# Patient Record
Sex: Female | Born: 1985 | Race: White | Hispanic: No | Marital: Married | Smoking: Former smoker
Health system: Southern US, Community
[De-identification: ages and names within clinical notes are randomized; demographics above are authoritative.]

## PROBLEM LIST (undated history)

## (undated) DIAGNOSIS — Z8619 Personal history of other infectious and parasitic diseases: Secondary | ICD-10-CM

## (undated) DIAGNOSIS — Z720 Tobacco use: Secondary | ICD-10-CM

## (undated) DIAGNOSIS — L509 Urticaria, unspecified: Secondary | ICD-10-CM

## (undated) HISTORY — DX: Urticaria, unspecified: L50.9

## (undated) HISTORY — PX: HERNIA REPAIR: SHX51

## (undated) HISTORY — DX: Personal history of other infectious and parasitic diseases: Z86.19

## (undated) HISTORY — DX: Tobacco use: Z72.0

## (undated) HISTORY — PX: CHOLECYSTECTOMY: SHX55

---

## 2011-04-04 LAB — LIPID PANEL
LDL Cholesterol: 164 mg/dL
Triglycerides: 220 mg/dL — AB (ref 40–160)

## 2011-04-04 LAB — CBC AND DIFFERENTIAL
HCT: 39 % (ref 36–46)
Platelets: 216 10*3/uL (ref 150–399)

## 2011-04-04 LAB — BASIC METABOLIC PANEL
Creatinine: 0.6 mg/dL (ref 0.5–1.1)
Glucose: 78 mg/dL

## 2011-08-10 ENCOUNTER — Encounter (HOSPITAL_BASED_OUTPATIENT_CLINIC_OR_DEPARTMENT_OTHER): Payer: Self-pay

## 2011-08-10 ENCOUNTER — Emergency Department (HOSPITAL_BASED_OUTPATIENT_CLINIC_OR_DEPARTMENT_OTHER)
Admission: EM | Admit: 2011-08-10 | Discharge: 2011-08-10 | Disposition: A | Discharge status: Home / Self Care | Payer: BC Managed Care – PPO | Attending: Emergency Medicine | Admitting: Emergency Medicine

## 2011-08-10 DIAGNOSIS — IMO0002 Reserved for concepts with insufficient information to code with codable children: Secondary | ICD-10-CM | POA: Insufficient documentation

## 2011-08-10 DIAGNOSIS — F17200 Nicotine dependence, unspecified, uncomplicated: Secondary | ICD-10-CM | POA: Insufficient documentation

## 2011-08-10 DIAGNOSIS — S7010XA Contusion of unspecified thigh, initial encounter: Secondary | ICD-10-CM | POA: Insufficient documentation

## 2011-08-10 DIAGNOSIS — W540XXA Bitten by dog, initial encounter: Secondary | ICD-10-CM | POA: Insufficient documentation

## 2011-08-10 MED ORDER — TETANUS-DIPHTH-ACELL PERTUSSIS 5-2.5-18.5 LF-MCG/0.5 IM SUSP
0.5000 mL | Freq: Once | INTRAMUSCULAR | Status: AC
  Administered 2011-08-10: 0.5 mL via INTRAMUSCULAR
  Filled 2011-08-10: qty 0.5

## 2011-08-10 MED ORDER — RABIES IMMUNE GLOBULIN 150 UNIT/ML IM INJ
20.0000 [IU]/kg | INJECTION | Freq: Once | INTRAMUSCULAR | Status: AC
  Administered 2011-08-10: 1425 [IU] via INTRAMUSCULAR
  Filled 2011-08-10: qty 9.5

## 2011-08-10 MED ORDER — RABIES VACCINE, PCEC IM SUSR
1.0000 mL | Freq: Once | INTRAMUSCULAR | Status: AC
  Administered 2011-08-10: 1 mL via INTRAMUSCULAR
  Filled 2011-08-10: qty 1

## 2011-08-10 NOTE — ED Notes (Signed)
Bit by unknown dog yesterday-bite to left thigh and left wrist

## 2011-08-10 NOTE — Discharge Instructions (Signed)
Animal Bite An animal bite can result in a scratch on the skin, deep open cut, puncture of the skin, crush injury, or tearing away of the skin or a body part. Dogs are responsible for most animal bites. Children are bitten more often than adults. An animal bite can range from very mild to more serious. A small bite from your house pet is no cause for alarm. However, some animal bites can become infected or injure a bone or other tissue.  SEEK MEDICAL CARE IF:  You notice warmth, redness, soreness, swelling, pus discharge, or a bad smell coming from the wound.   You have a red line on the skin coming from the wound.   You have a fever, chills, or a general ill feeling.   You have nausea or vomiting.   You have continued or worsening pain.   You have trouble moving the injured part.   You have other questions or concerns.  MAKE SURE YOU:  Understand these instructions.   Will watch your condition.   Will get help right away if you are not doing well or get worse.  Document Released: 09/29/2010 Document Revised: 12/31/2010 Document Reviewed: 09/29/2010 [facility] Patient Information 2012 [place], [place].

## 2011-08-10 NOTE — ED Provider Notes (Signed)
History     CSN: [id number]  Arrival date & time 08/10/11  1546   First MD Initiated Contact with Patient 08/10/11 1556      Chief Complaint  Patient presents with  . Animal Bite    (Consider location/radiation/quality/duration/timing/severity/associated sxs/prior treatment) HPI Pt sustained dog bite to Left thigh yesterday. Unknown dog or owner. Unknown rabies status of dog. Pt had some minor scratches to L wrist. She presents for rabies prophylaxis and Td update. Pt states she cleaned the wound thoroughly after bite yesterday and applied antibiotic cream History reviewed. No pertinent past medical history.  History reviewed. No pertinent past surgical history.  No family history on file.  History  Substance Use Topics  . Smoking status: Current Everyday Smoker  . Smokeless tobacco: Not on file  . Alcohol Use: Yes    OB History    Grav Para Term Preterm Abortions TAB SAB Ect Mult Living                  Review of Systems  Gastrointestinal: Negative for vomiting and diarrhea.  Skin: Positive for wound. Negative for rash.    Allergies  Review of patient's allergies indicates no known allergies.  Home Medications   Current Outpatient Rx  Name Route Sig Dispense Refill  . IBUPROFEN 200 MG PO TABS Oral Take 400 mg by mouth every 6 (six) hours as needed. Patient used this medication for menstrual cramps.      BP 127/81  Pulse 80  Temp 98 F (36.7 C) (Oral)  Resp 16  Ht 5\' 2"  (1.575 m)  Wt 159 lb (72.122 kg)  BMI 29.08 kg/m2  SpO2 100%  LMP 07/30/2011  Physical Exam  Nursing note and vitals reviewed. Constitutional: She is oriented to person, place, and time. She appears well-developed and well-nourished. No distress.  HENT:  Head: Normocephalic and atraumatic.  Mouth/Throat: Oropharynx is clear and moist.  Eyes: EOM are normal.  Neck: Neck supple.  Pulmonary/Chest: Effort normal.  Musculoskeletal: Normal range of motion.  Neurological: She is alert  and oriented to person, place, and time.  Skin: Skin is warm and dry.       Bite mark to left thigh with signs of healing. Mild redness but not evidence of cellulitis. No FB palpated. Surrounding ecchymosis. L wrist with mild abrasions. Normal ROM.   Psychiatric: She has a normal mood and affect. Her behavior is normal.    ED Course  Procedures (including critical care time)  Labs Reviewed - No data to display No results found.   1. Dog bite       MDM  Will give Td, rabies IG and 1 dose of vaccine. Nursing staff will arrange further dosing. Do not believe antibiotics needed given minor wound and no evidence of infection at this point. Pt should return for worsening reddening or any concerns        Loren Racer, MD 08/10/11 1645

## 2011-08-13 ENCOUNTER — Emergency Department
Admission: EM | Admit: 2011-08-13 | Discharge: 2011-08-13 | Disposition: A | Discharge status: Home / Self Care | Payer: BC Managed Care – PPO | Source: Home / Self Care

## 2011-08-13 DIAGNOSIS — Z203 Contact with and (suspected) exposure to rabies: Secondary | ICD-10-CM

## 2011-08-13 DIAGNOSIS — Z23 Encounter for immunization: Secondary | ICD-10-CM

## 2011-08-13 MED ORDER — RABIES VACCINE, PCEC IM SUSR
1.0000 mL | Freq: Once | INTRAMUSCULAR | Status: AC
  Administered 2011-08-13: 1 mL via INTRAMUSCULAR

## 2011-08-13 NOTE — ED Notes (Signed)
Patient observed for 15 minutes and no visible reaction.

## 2011-08-13 NOTE — ED Notes (Signed)
Patient is here for her 2nd rabies vaccine

## 2011-08-17 ENCOUNTER — Encounter: Payer: Self-pay | Admitting: *Deleted

## 2011-08-17 ENCOUNTER — Emergency Department (INDEPENDENT_AMBULATORY_CARE_PROVIDER_SITE_OTHER)
Admission: EM | Admit: 2011-08-17 | Discharge: 2011-08-17 | Disposition: A | Discharge status: Home / Self Care | Payer: BC Managed Care – PPO | Source: Home / Self Care

## 2011-08-17 DIAGNOSIS — Z23 Encounter for immunization: Secondary | ICD-10-CM

## 2011-08-17 MED ORDER — RABIES VACCINE, PCEC IM SUSR
1.0000 mL | Freq: Once | INTRAMUSCULAR | Status: AC
  Administered 2011-08-17: 1 mL via INTRAMUSCULAR

## 2011-08-17 NOTE — ED Notes (Signed)
The pt is here today for her third rabies vaccine.

## 2011-08-24 ENCOUNTER — Encounter: Payer: Self-pay | Admitting: *Deleted

## 2011-08-24 ENCOUNTER — Emergency Department
Admission: EM | Admit: 2011-08-24 | Discharge: 2011-08-24 | Disposition: A | Discharge status: Home / Self Care | Payer: BC Managed Care – PPO | Source: Home / Self Care

## 2011-08-24 DIAGNOSIS — Z23 Encounter for immunization: Secondary | ICD-10-CM

## 2011-08-24 MED ORDER — RABIES VACCINE, PCEC IM SUSR
1.0000 mL | Freq: Once | INTRAMUSCULAR | Status: AC
  Administered 2011-08-24: 1 mL via INTRAMUSCULAR

## 2011-08-24 NOTE — ED Notes (Signed)
The pt is here today for her 4th rabies vaccine.

## 2012-05-03 ENCOUNTER — Encounter: Payer: Self-pay | Admitting: Family Medicine

## 2012-05-03 ENCOUNTER — Ambulatory Visit (INDEPENDENT_AMBULATORY_CARE_PROVIDER_SITE_OTHER): Payer: BC Managed Care – PPO | Admitting: Family Medicine

## 2012-05-03 VITALS — BP 113/71 | HR 86 | Temp 97.4°F | Ht 60.75 in | Wt 171.0 lb

## 2012-05-03 DIAGNOSIS — A499 Bacterial infection, unspecified: Secondary | ICD-10-CM

## 2012-05-03 DIAGNOSIS — J208 Acute bronchitis due to other specified organisms: Secondary | ICD-10-CM

## 2012-05-03 DIAGNOSIS — B372 Candidiasis of skin and nail: Secondary | ICD-10-CM

## 2012-05-03 DIAGNOSIS — J209 Acute bronchitis, unspecified: Secondary | ICD-10-CM

## 2012-05-03 DIAGNOSIS — Z720 Tobacco use: Secondary | ICD-10-CM | POA: Insufficient documentation

## 2012-05-03 DIAGNOSIS — F17200 Nicotine dependence, unspecified, uncomplicated: Secondary | ICD-10-CM

## 2012-05-03 DIAGNOSIS — B9689 Other specified bacterial agents as the cause of diseases classified elsewhere: Secondary | ICD-10-CM

## 2012-05-03 DIAGNOSIS — B360 Pityriasis versicolor: Secondary | ICD-10-CM

## 2012-05-03 HISTORY — DX: Tobacco use: Z72.0

## 2012-05-03 MED ORDER — CLOTRIMAZOLE 1 % EX CREA: TOPICAL_CREAM | CUTANEOUS | Status: AC

## 2012-05-03 MED ORDER — DOXYCYCLINE HYCLATE 100 MG PO TABS: ORAL_TABLET | ORAL | Status: AC

## 2012-05-03 MED ORDER — KETOCONAZOLE 2 % EX SHAM: MEDICATED_SHAMPOO | CUTANEOUS | Status: DC

## 2012-05-03 NOTE — Patient Instructions (Addendum)
330 or greater is your peak flow goal.

## 2012-05-03 NOTE — Progress Notes (Signed)
CC: Cynthia Christensen is a 27 y.o. female is here for Establish Care, chest congestion and yeast on skin   Subjective: HPI:  Pleasant 27 year old here to establish care  Patient complains of a itchy rash underneath her right breast. It has been present for one week. Seems to be getting worse on a daily basis. Worse in moist environments. No interventions as of yet. Described only as red and itchy. She has had this multiple times years ago.  Patient complains of white spots on her upper trunk extending towards the biceps. Has been present for 2 weeks. Has had in the past, seems to get worse when exposed to the sun. Improves with Selsun Blue. She's been using this for the past week daily basis and symptoms are no better nor worse since starting. Discoloration is not itchy nor painful. She is self-conscious about it.  Patient complains of a sore throat and cough. Cough is productive of white sputum. Has been present for 5 days and was preceded by facial pressure and runny nose that is now gone since starting Claritin. Describes wheezing and chest tightness especially with exertion. Denies orthopnea, peripheral edema, blood and sputum. Smokes half a pack of cigarettes a day.  Review of Systems - General ROS: negative for - chills, fever, night sweats, weight gain or weight loss Ophthalmic ROS: negative for - decreased vision Psychological ROS: negative for - anxiety or depression ENT ROS: negative for - hearing change, tinnitus Hematological and Lymphatic ROS: negative for - bleeding problems, bruising or swollen lymph nodes Breast ROS: negative Cardiovascular ROS: no chest pain on exertion Gastrointestinal ROS: no abdominal pain, change in bowel habits, or black or bloody stools Genito-Urinary ROS: negative for - genital discharge, genital ulcers, incontinence or abnormal bleeding from genitals Musculoskeletal ROS: negative for - joint pain or muscle pain Neurological ROS: negative for - headaches or  memory loss Dermatological ROS: negative for lumps, mole changes, rash and skin lesion changes other than above  Past Medical History  Diagnosis Date  . Tobacco use 05/03/2012     Family History  Problem Relation Age of Onset  . Lung cancer      grandmother  . Heart attack      grandfather     History  Substance Use Topics  . Smoking status: Current Every Day Smoker -- 0.50 packs/day for 5 years    Types: Cigarettes  . Smokeless tobacco: Not on file  . Alcohol Use: Yes     Objective: Filed Vitals:   05/03/12 1506  BP: 113/71  Pulse: 86  Temp: 97.4 F (36.3 C)    General: Alert and Oriented, No Acute Distress HEENT: Pupils equal, round, reactive to light. Conjunctivae clear.  External ears unremarkable, canals clear with intact TMs with appropriate landmarks.  Middle ear appears open without effusion. Pink inferior turbinates.  Moist mucous membranes, pharynx without inflammation nor lesions.  Neck supple without palpable lymphadenopathy nor abnormal masses. Lungs: Comfortable work of breathing, no accessory muscle use, mild end expiratory wheezing without rhonchi nor rales or signs of consolidation Cardiac: Regular rate and rhythm. Normal S1/S2.  No murmurs, rubs, nor gallops.   Extremities: No peripheral edema.  Strong peripheral pulses.  Mental Status: No depression, anxiety, nor agitation. Skin: Warm and dry. Mild redness under the right breast with satellite lesions, scattered hypopigmented lesions in a mild sunburn background on the shoulders and biceps  Assessment & Plan: Cynthia Christensen was seen today for establish care, chest congestion and yeast on skin.  Diagnoses and associated orders for this visit:  Tinea versicolor - ketoconazole (NIZORAL) 2 % shampoo; Apply to splotchy/red spots three consecutive days for three weeks.  Candidal skin infection - clotrimazole (LOTRIMIN) 1 % cream; Apply to affected areas twice a day for up to four weeks, applying up to two weeks  after resolution of symptoms.  Acute bacterial bronchitis - doxycycline (VIBRA-TABS) 100 MG tablet; One by mouth twice a day for ten days.  Tobacco use  Other Orders - loratadine (CLARITIN) 10 MG tablet; Take 10 mg by mouth daily.    Tinea versicolor: Encouraged patient to start ketoconazole shampoo use as a body wash to the torso. Skin candidiasis: Encouraged patient to apply clotrimazole as directed in the skin folds with redness. Acute bacterial bronchitis: Discussed benefits of smoking by using nicotine replacement therapy, start doxycycline, continue Claritin and consider starting twice a day Mucinex.  Return if symptoms worsen or fail to improve.

## 2012-05-22 ENCOUNTER — Encounter: Payer: Self-pay | Admitting: Family Medicine

## 2012-05-22 ENCOUNTER — Encounter: Payer: Self-pay | Admitting: *Deleted

## 2012-05-22 DIAGNOSIS — E785 Hyperlipidemia, unspecified: Secondary | ICD-10-CM

## 2012-05-22 DIAGNOSIS — L680 Hirsutism: Secondary | ICD-10-CM | POA: Insufficient documentation

## 2012-06-27 ENCOUNTER — Encounter: Payer: Self-pay | Admitting: Family Medicine

## 2012-06-27 ENCOUNTER — Ambulatory Visit (INDEPENDENT_AMBULATORY_CARE_PROVIDER_SITE_OTHER): Payer: BC Managed Care – PPO | Admitting: Family Medicine

## 2012-06-27 VITALS — BP 113/77 | HR 63 | Temp 97.7°F | Wt 176.0 lb

## 2012-06-27 DIAGNOSIS — J209 Acute bronchitis, unspecified: Secondary | ICD-10-CM

## 2012-06-27 MED ORDER — AZITHROMYCIN 250 MG PO TABS: ORAL_TABLET | ORAL | Status: AC

## 2012-06-27 MED ORDER — HYDROCODONE-HOMATROPINE 5-1.5 MG/5ML PO SYRP: 5.0000 mL | ORAL_SOLUTION | Freq: Every evening | ORAL | Status: DC | PRN

## 2012-06-27 MED ORDER — PREDNISONE 20 MG PO TABS: ORAL_TABLET | ORAL | Status: AC

## 2012-06-27 NOTE — Progress Notes (Signed)
CC: Cynthia Christensen is a 27 y.o. female is here for chest congestion   Subjective: HPI:  Complains of one week of worsening productive cough with coughing spells to the point where she almost throws up. Symptoms are moderate in severity worsening on a daily basis.  symptoms started while in [place]. There is been wheezing and shortness of breath at night is slightly improved with albuterol requiring 2 puffs twice a day since onset of above symptoms. Admits chest congestion but no chest pain. Denies back pain continues to smoke. She denies fevers, chills, and vomiting, abdominal pain, headache, motor sensory disturbances.  She has tried guaifenesin up to 4 times a day without much improvement   Review Of Systems Outlined In HPI  Past Medical History  Diagnosis Date  . Tobacco use 05/03/2012     Family History  Problem Relation Age of Onset  . Lung cancer      grandmother  . Heart attack      grandfather  . Thyroid disease Mother      History  Substance Use Topics  . Smoking status: Current Every Day Smoker -- 0.50 packs/day for 5 years    Types: Cigarettes  . Smokeless tobacco: Not on file  . Alcohol Use: Yes     Objective: Filed Vitals:   06/27/12 0922  BP: 113/77  Pulse: 63  Temp: 97.7 F (36.5 C)    General: Alert and Oriented, No Acute Distress HEENT: Pupils equal, round, reactive to light. Conjunctivae clear.  External ears unremarkable, canals clear with intact TMs with appropriate landmarks.  Middle ear appears open without effusion. Pink inferior turbinates.  Moist mucous membranes, pharynx without inflammation nor lesions.  Neck supple without palpable lymphadenopathy nor abnormal masses. Lungs:  comfortable work of breathing, good air movement, trace end expiratory wheezing with mild central rhonchi without rales.  Cardiac: Regular rate and rhythm. Normal S1/S2.  No murmurs, rubs, nor gallops.   Mental Status: No depression, anxiety, nor agitation.   Assessment &  Plan: Cynthia Christensen was seen today for chest congestion.  Diagnoses and associated orders for this visit:  Acute bronchitis - predniSONE (DELTASONE) 20 MG tablet; Two tabs at once daily for five days. - azithromycin (ZITHROMAX) 250 MG tablet; Take two tabs at once on day 1, then one tab daily on days 2-5. - HYDROcodone-homatropine (HYCODAN) 5-1.5 MG/5ML syrup; Take 5 mLs by mouth at bedtime as needed for cough.    Acute bronchitis in a smoker: Start moderate dose prednisone with azithromycin may use Hycodan to help with sleep, encouraged to use Alka-Seltzer cold and sinus during the day for symptom control.  Return if symptoms worsen or fail to improve.

## 2012-08-07 DIAGNOSIS — Z87898 Personal history of other specified conditions: Secondary | ICD-10-CM | POA: Insufficient documentation

## 2012-08-07 DIAGNOSIS — L732 Hidradenitis suppurativa: Secondary | ICD-10-CM | POA: Insufficient documentation

## 2012-11-24 ENCOUNTER — Ambulatory Visit (INDEPENDENT_AMBULATORY_CARE_PROVIDER_SITE_OTHER): Payer: BC Managed Care – PPO | Admitting: Family Medicine

## 2012-11-24 ENCOUNTER — Encounter: Payer: Self-pay | Admitting: Family Medicine

## 2012-11-24 VITALS — BP 118/74 | HR 76 | Wt 184.0 lb

## 2012-11-24 DIAGNOSIS — K648 Other hemorrhoids: Secondary | ICD-10-CM

## 2012-11-24 MED ORDER — HYDROCORTISONE ACE-PRAMOXINE 1-1 % RE CREA: TOPICAL_CREAM | Freq: Two times a day (BID) | RECTAL | Status: DC

## 2012-11-24 NOTE — Progress Notes (Signed)
CC: Cynthia Christensen is a 27 y.o. female is here for Hemorrhoids   Subjective: HPI:  Complains of rectal swelling and mild discomfort that has been present for one week has not been getting better or worse since onset. Has been using Preparation H without much help. Scant bleeding yesterda with bowel movement. She denies constipation or straining to defecate nor pregnancy she's never had this before. Symptoms are worse with sitting for long periods of time she is felt a mass that enlarges throughout the day shrink overnight. Denies fevers, chills, abdominal pain, nor unintentional weight loss no family history of colon cancer   Review Of Systems Outlined In HPI  Past Medical History  Diagnosis Date  . Tobacco use 05/03/2012     Family History  Problem Relation Age of Onset  . Lung cancer      grandmother  . Heart attack      grandfather  . Thyroid disease Mother      History  Substance Use Topics  . Smoking status: Current Every Day Smoker -- 0.50 packs/day for 5 years    Types: Cigarettes  . Smokeless tobacco: Not on file  . Alcohol Use: Yes     Objective: Filed Vitals:   11/24/12 0827  BP: 118/74  Pulse: 76    General: Alert and Oriented, No Acute Distress HEENT: Pupils equal, round, reactive to light. Conjunctivae clear.  Moist mucous membranes Lungs:  are clear and comfortable work of breathing  Cardiac: Regular rate and rhythm. Normal S1/S2.  No murmurs, rubs, nor gallops.   Rectal: Politely declines rectal exam Extremities: No peripheral edema.  Strong peripheral pulses.  Mental Status: No depression, anxiety, nor agitation. Skin: Warm and dry.  Assessment & Plan: Cynthia Christensen was seen today for hemorrhoids.  Diagnoses and associated orders for this visit:  Prolapsed internal hemorrhoids - pramoxine-hydrocortisone (PROCTOCREAM-HC) 1-1 % rectal cream; Place rectally 2 (two) times daily.    Start psyllium powder 3 heaping teaspoons daily until one week after symptoms  subside, proctocream hc starting today.  Return to rule out thrombosis if pain worsens.  Return if symptoms worsen or fail to improve.

## 2013-01-03 ENCOUNTER — Encounter: Payer: Self-pay | Admitting: Family Medicine

## 2013-01-03 ENCOUNTER — Ambulatory Visit (INDEPENDENT_AMBULATORY_CARE_PROVIDER_SITE_OTHER): Payer: BC Managed Care – PPO | Admitting: Family Medicine

## 2013-01-03 VITALS — BP 108/67 | HR 81 | Resp 16 | Wt 185.0 lb

## 2013-01-03 DIAGNOSIS — J209 Acute bronchitis, unspecified: Secondary | ICD-10-CM

## 2013-01-03 DIAGNOSIS — J019 Acute sinusitis, unspecified: Secondary | ICD-10-CM

## 2013-01-03 MED ORDER — HYDROCODONE-HOMATROPINE 5-1.5 MG/5ML PO SYRP: 5.0000 mL | ORAL_SOLUTION | Freq: Every evening | ORAL | Status: DC | PRN

## 2013-01-03 MED ORDER — SULFAMETHOXAZOLE-TMP DS 800-160 MG PO TABS: 1.0000 | ORAL_TABLET | Freq: Two times a day (BID) | ORAL | Status: DC

## 2013-01-03 NOTE — Progress Notes (Signed)
   Subjective:    Patient ID: Cynthia Christensen, female    DOB: 27-Dec-1985, 27 y.o.   MRN: [id number]  HPI  7 days of cough and congestion.  Says usually gets bronchitis twice a year. Has been on Amox x 4 days BID. + nasal congestion.  + HA. Pressure under both eyes.  No ST or ear pain. No fever, chills. + sweats.  + fatigue.  Sputum is yellow/green. No hx of asthma. + smoker.   Review of Systems     Objective:   Physical Exam  Constitutional: She is oriented to person, place, and time. She appears well-developed and well-nourished.  HENT:  Head: Normocephalic and atraumatic.  Right Ear: External ear normal.  Left Ear: External ear normal.  Nose: Nose normal.  Mouth/Throat: Oropharynx is clear and moist.  TMs and canals are clear. Conjunctiva are injected.  Eyes: Conjunctivae and EOM are normal. Pupils are equal, round, and reactive to light.  Neck: Neck supple. No thyromegaly present.  Cardiovascular: Normal rate, regular rhythm and normal heart sounds.   Pulmonary/Chest: Effort normal. She has wheezes.  Slight expiratory wheeze at the bases bilaterally.  Lymphadenopathy:    She has no cervical adenopathy.  Neurological: She is alert and oriented to person, place, and time.  Skin: Skin is warm and dry.  Psychiatric: She has a normal mood and affect. Her behavior is normal.          Assessment & Plan:  Acute sinusitis/Bronchitis - will treat with Bactrim DS. Call if not better in one weell . Continue symptomatically her. Encourage smoking cessation. Refill her cough syrup. Last prescription was in June.

## 2013-01-03 NOTE — Patient Instructions (Signed)
Acute Bronchitis Bronchitis is inflammation of the airways that extend from the windpipe into the lungs (bronchi). The inflammation often causes mucus to develop. This leads to a cough, which is the most common symptom of bronchitis.  In acute bronchitis, the condition usually develops suddenly and goes away over time, usually in a couple weeks. Smoking, allergies, and asthma can make bronchitis worse. Repeated episodes of bronchitis may cause further lung problems.  CAUSES Acute bronchitis is most often caused by the same virus that causes a cold. The virus can spread from person to person (contagious).  SIGNS AND SYMPTOMS   Cough.   Fever.   Coughing up mucus.   Body aches.   Chest congestion.   Chills.   Shortness of breath.   Sore throat.  DIAGNOSIS  Acute bronchitis is usually diagnosed through a physical exam. Tests, such as chest X-rays, are sometimes done to rule out other conditions.  TREATMENT  Acute bronchitis usually goes away in a couple weeks. Often times, no medical treatment is necessary. Medicines are sometimes given for relief of fever or cough. Antibiotics are usually not needed but may be prescribed in certain situations. In some cases, an inhaler may be recommended to help reduce shortness of breath and control the cough. A cool mist vaporizer may also be used to help thin bronchial secretions and make it easier to clear the chest.  HOME CARE INSTRUCTIONS  Get plenty of rest.   Drink enough fluids to keep your urine clear or pale yellow (unless you have a medical condition that requires fluid restriction). Increasing fluids may help thin your secretions and will prevent dehydration.   Only take over-the-counter or prescription medicines as directed by your health care provider.   Avoid smoking and secondhand smoke. Exposure to cigarette smoke or irritating chemicals will make bronchitis worse. If you are a smoker, consider using nicotine gum or skin  patches to help control withdrawal symptoms. Quitting smoking will help your lungs heal faster.   Reduce the chances of another bout of acute bronchitis by washing your hands frequently, avoiding people with cold symptoms, and trying not to touch your hands to your mouth, nose, or eyes.   Follow up with your health care provider as directed.  SEEK MEDICAL CARE IF: Your symptoms do not improve after 1 week of treatment.  SEEK IMMEDIATE MEDICAL CARE IF:  You develop an increased fever or chills.   You have chest pain.   You have severe shortness of breath.  You have bloody sputum.   You develop dehydration.  You develop fainting.  You develop repeated vomiting.  You develop a severe headache. MAKE SURE YOU:   Understand these instructions.  Will watch your condition.  Will get help right away if you are not doing well or get worse. Document Released: 02/19/2004 Document Revised: 09/13/2012 Document Reviewed: 07/04/2012 [employer] Patient Information 2014 [employer].  Sinusitis Sinusitis is redness, soreness, and swelling (inflammation) of the paranasal sinuses. Paranasal sinuses are air pockets within the bones of your face (beneath the eyes, the middle of the forehead, or above the eyes). In healthy paranasal sinuses, mucus is able to drain out, and air is able to circulate through them by way of your nose. However, when your paranasal sinuses are inflamed, mucus and air can become trapped. This can allow bacteria and other germs to grow and cause infection. Sinusitis can develop quickly and last only a short time (acute) or continue over a long period (chronic). Sinusitis that   lasts for more than 12 weeks is considered chronic.  CAUSES  Causes of sinusitis include:  Allergies.  Structural abnormalities, such as displacement of the cartilage that separates your nostrils (deviated septum), which can decrease the air flow through your nose and sinuses and affect sinus  drainage.  Functional abnormalities, such as when the small hairs (cilia) that line your sinuses and help remove mucus do not work properly or are not present. SYMPTOMS  Symptoms of acute and chronic sinusitis are the same. The primary symptoms are pain and pressure around the affected sinuses. Other symptoms include:  Upper toothache.  Earache.  Headache.  Bad breath.  Decreased sense of smell and taste.  A cough, which worsens when you are lying flat.  Fatigue.  Fever.  Thick drainage from your nose, which often is green and may contain pus (purulent).  Swelling and warmth over the affected sinuses. DIAGNOSIS  Your caregiver will perform a physical exam. During the exam, your caregiver may:  Look in your nose for signs of abnormal growths in your nostrils (nasal polyps).  Tap over the affected sinus to check for signs of infection.  View the inside of your sinuses (endoscopy) with a special imaging device with a light attached (endoscope), which is inserted into your sinuses. If your caregiver suspects that you have chronic sinusitis, one or more of the following tests may be recommended:  Allergy tests.  Nasal culture A sample of mucus is taken from your nose and sent to a lab and screened for bacteria.  Nasal cytology A sample of mucus is taken from your nose and examined by your caregiver to determine if your sinusitis is related to an allergy. TREATMENT  Most cases of acute sinusitis are related to a viral infection and will resolve on their own within 10 days. Sometimes medicines are prescribed to help relieve symptoms (pain medicine, decongestants, nasal steroid sprays, or saline sprays).  However, for sinusitis related to a bacterial infection, your caregiver will prescribe antibiotic medicines. These are medicines that will help kill the bacteria causing the infection.  Rarely, sinusitis is caused by a fungal infection. In theses cases, your caregiver will  prescribe antifungal medicine. For some cases of chronic sinusitis, surgery is needed. Generally, these are cases in which sinusitis recurs more than 3 times per year, despite other treatments. HOME CARE INSTRUCTIONS   Drink plenty of water. Water helps thin the mucus so your sinuses can drain more easily.  Use a humidifier.  Inhale steam 3 to 4 times a day (for example, sit in the bathroom with the shower running).  Apply a warm, moist washcloth to your face 3 to 4 times a day, or as directed by your caregiver.  Use saline nasal sprays to help moisten and clean your sinuses.  Take over-the-counter or prescription medicines for pain, discomfort, or fever only as directed by your caregiver. SEEK IMMEDIATE MEDICAL CARE IF:  You have increasing pain or severe headaches.  You have nausea, vomiting, or drowsiness.  You have swelling around your face.  You have vision problems.  You have a stiff neck.  You have difficulty breathing. MAKE SURE YOU:   Understand these instructions.  Will watch your condition.  Will get help right away if you are not doing well or get worse. Document Released: 01/11/2005 Document Revised: 04/05/2011 Document Reviewed: 01/26/2011 [employer] Patient Information 2014 [employer].  

## 2013-06-01 ENCOUNTER — Ambulatory Visit (INDEPENDENT_AMBULATORY_CARE_PROVIDER_SITE_OTHER): Payer: BC Managed Care – PPO | Admitting: Family Medicine

## 2013-06-01 ENCOUNTER — Encounter: Payer: Self-pay | Admitting: Family Medicine

## 2013-06-01 VITALS — BP 107/66 | HR 71 | Temp 97.9°F | Wt 188.0 lb

## 2013-06-01 DIAGNOSIS — J329 Chronic sinusitis, unspecified: Secondary | ICD-10-CM

## 2013-06-01 DIAGNOSIS — L700 Acne vulgaris: Secondary | ICD-10-CM

## 2013-06-01 DIAGNOSIS — B9689 Other specified bacterial agents as the cause of diseases classified elsewhere: Secondary | ICD-10-CM

## 2013-06-01 DIAGNOSIS — L708 Other acne: Secondary | ICD-10-CM

## 2013-06-01 DIAGNOSIS — A499 Bacterial infection, unspecified: Secondary | ICD-10-CM

## 2013-06-01 DIAGNOSIS — J209 Acute bronchitis, unspecified: Secondary | ICD-10-CM

## 2013-06-01 MED ORDER — HYDROCODONE-HOMATROPINE 5-1.5 MG/5ML PO SYRP: 5.0000 mL | ORAL_SOLUTION | Freq: Every evening | ORAL | Status: DC | PRN

## 2013-06-01 MED ORDER — DOXYCYCLINE HYCLATE 100 MG PO TABS: ORAL_TABLET | ORAL | Status: AC

## 2013-06-01 NOTE — Progress Notes (Signed)
CC: Cynthia Christensen is a 28 y.o. female is here for congestion and sinus pressure and Otalgia   Subjective: HPI:  Complains of nasal congestion and facial pressure beneath both eyes that has been present for the past week worsening on a daily basis now moderate in severity. Accompanied by dry cough without shortness of breath or wheezing. Symptoms have not improved with Afrin nor Mucinex. Accompanied by chills but no fevers. Denies blood in sputum, chest pain, motor sensory disturbances. Cough is significantly interfering with sleep  She's requesting referral to dermatology for management of cystic acne   Review Of Systems Outlined In HPI  Past Medical History  Diagnosis Date  . Tobacco use 05/03/2012    No past surgical history on file. Family History  Problem Relation Age of Onset  . Lung cancer      grandmother  . Heart attack      grandfather  . Thyroid disease Mother     History   Social History  . Marital Status: Single    Spouse Name: N/A    Number of Children: N/A  . Years of Education: N/A   Occupational History  . Not on file.   Social History Main Topics  . Smoking status: Current Every Day Smoker -- 0.50 packs/day for 5 years    Types: Cigarettes  . Smokeless tobacco: Not on file  . Alcohol Use: Yes  . Drug Use: Yes  . Sexual Activity: Yes    Birth Control/ Protection: None   Other Topics Concern  . Not on file   Social History Narrative  . No narrative on file     Objective: BP 107/66  Pulse 71  Temp(Src) 97.9 F (36.6 C) (Oral)  Wt 188 lb (85.276 kg)  General: Alert and Oriented, No Acute Distress HEENT: Pupils equal, round, reactive to light. Conjunctivae clear.  External ears unremarkable, canals clear with intact TMs with appropriate landmarks.  Middle ear appears open without effusion. Pink inferior turbinates.  Moist mucous membranes, pharynx without inflammation nor lesions however moderate cobblestoning and postnasal drip.  Neck supple  without palpable lymphadenopathy nor abnormal masses. Lungs: Clear to auscultation bilaterally, no wheezing/ronchi/rales.  Comfortable work of breathing. Good air movement. Mental Status: No depression, anxiety, nor agitation. Skin: Warm and dry.  Assessment & Plan: Cynthia Christensen was seen today for congestion and sinus pressure and otalgia.  Diagnoses and associated orders for this visit:  Bacterial sinusitis - doxycycline (VIBRA-TABS) 100 MG tablet; One by mouth twice a day for ten days.  Acute bronchitis - HYDROcodone-homatropine (HYCODAN) 5-1.5 MG/5ML syrup; Take 5 mLs by mouth at bedtime as needed for cough.  Cystic acne - Ambulatory referral to Dermatology    Start doxycycline for bacterial sinusitis and may use Hycodan as needed for cough. Per her request a referral has been made to dermatology    Return if symptoms worsen or fail to improve.

## 2013-06-07 ENCOUNTER — Telehealth: Payer: Self-pay | Admitting: *Deleted

## 2013-06-07 NOTE — Telephone Encounter (Signed)
Pt called and states she has three more days of abx left and now the left side of her neck is swollen and tender. Lymph node. I advised pt to schedule an appt to come in

## 2013-06-08 ENCOUNTER — Ambulatory Visit (INDEPENDENT_AMBULATORY_CARE_PROVIDER_SITE_OTHER): Payer: BC Managed Care – PPO | Admitting: Family Medicine

## 2013-06-08 ENCOUNTER — Encounter: Payer: Self-pay | Admitting: Family Medicine

## 2013-06-08 VITALS — BP 109/74 | HR 82 | Temp 97.5°F | Wt 185.0 lb

## 2013-06-08 DIAGNOSIS — K1120 Sialoadenitis, unspecified: Secondary | ICD-10-CM

## 2013-06-08 MED ORDER — AMOXICILLIN-POT CLAVULANATE ER 1000-62.5 MG PO TB12: 1.0000 | ORAL_TABLET | Freq: Two times a day (BID) | ORAL | Status: DC

## 2013-06-08 MED ORDER — FLUCONAZOLE 150 MG PO TABS: 150.0000 mg | ORAL_TABLET | Freq: Once | ORAL | Status: DC

## 2013-06-08 MED ORDER — AMOXICILLIN-POT CLAVULANATE ER 1000-62.5 MG PO TB12: 2.0000 | ORAL_TABLET | Freq: Two times a day (BID) | ORAL | Status: DC

## 2013-06-08 NOTE — Progress Notes (Signed)
CC: Cynthia Christensen is a 28 y.o. female is here for left neck pain   Subjective: HPI:  Complains of left neck pain localized around the angle of the mandible. Radiates slightly into the ear and down the left neck. Worse with swallowing, chewing, or rotating the neck left or right. Pain is worse with pressing on this region with her hand. Interventions include ibuprofen which slightly dulled the pain which is moderate in severity without this medication. Described only as pain. She denies fevers, chills, nausea, vomiting, difficulty swallowing, facial pressure, cough, shortness of breath,.  Review of systems is positive for dry mouth on the left.    Review Of Systems Outlined In HPI  Past Medical History  Diagnosis Date  . Tobacco use 05/03/2012    No past surgical history on file. Family History  Problem Relation Age of Onset  . Lung cancer      grandmother  . Heart attack      grandfather  . Thyroid disease Mother     History   Social History  . Marital Status: Single    Spouse Name: N/A    Number of Children: N/A  . Years of Education: N/A   Occupational History  . Not on file.   Social History Main Topics  . Smoking status: Current Every Day Smoker -- 0.50 packs/day for 5 years    Types: Cigarettes  . Smokeless tobacco: Not on file  . Alcohol Use: Yes  . Drug Use: Yes  . Sexual Activity: Yes    Birth Control/ Protection: None   Other Topics Concern  . Not on file   Social History Narrative  . No narrative on file     Objective: BP 109/74  Pulse 82  Temp(Src) 97.5 F (36.4 C) (Oral)  Wt 185 lb (83.915 kg)  General: Alert and Oriented, No Acute Distress HEENT: Pupils equal, round, reactive to light. Conjunctivae clear.  External ears unremarkable, canals clear with intact TMs with appropriate landmarks.  Middle ear appears open without effusion. Pink inferior turbinates.  Moist mucous membranes, pharynx without inflammation nor lesions.  left Stensen's duct is  painless and has a normal appearance.   tenderness of the left parotid gland which is slightly enlarged, no palpable lymphadenopathy.  Lungs: Clear to auscultation bilaterally, no wheezing/ronchi/rales.  Comfortable work of breathing. Good air movement. Cardiac: Regular rate and rhythm. Normal S1/S2.  No murmurs, rubs, nor gallops.   Mental Status: No depression, anxiety, nor agitation. Skin: Warm and dry.  Assessment & Plan: Cynthia Christensen was seen today for left neck pain.  Diagnoses and associated orders for this visit:  Salivary gland infection - amoxicillin-clavulanate (AUGMENTIN XR) 1000-62.5 MG per tablet; Take 2 tablets by mouth 2 (two) times daily.  Other Orders - fluconazole (DIFLUCAN) 150 MG tablet; Take 1 tablet (150 mg total) by mouth once.     I believe she may have a mild left parotid gland infection, she is up-to-date on MMR immunizations. We'll cover for bacterial involvement with Augmentin XR  Return if symptoms worsen or fail to improve.  

## 2013-11-05 ENCOUNTER — Ambulatory Visit (INDEPENDENT_AMBULATORY_CARE_PROVIDER_SITE_OTHER): Payer: BC Managed Care – PPO | Admitting: Family Medicine

## 2013-11-05 ENCOUNTER — Encounter: Payer: Self-pay | Admitting: Family Medicine

## 2013-11-05 VITALS — BP 119/76 | HR 66 | Temp 97.9°F | Wt 187.0 lb

## 2013-11-05 DIAGNOSIS — A499 Bacterial infection, unspecified: Secondary | ICD-10-CM | POA: Diagnosis not present

## 2013-11-05 DIAGNOSIS — J329 Chronic sinusitis, unspecified: Secondary | ICD-10-CM | POA: Diagnosis not present

## 2013-11-05 DIAGNOSIS — B9689 Other specified bacterial agents as the cause of diseases classified elsewhere: Secondary | ICD-10-CM

## 2013-11-05 DIAGNOSIS — Z720 Tobacco use: Secondary | ICD-10-CM

## 2013-11-05 DIAGNOSIS — F17200 Nicotine dependence, unspecified, uncomplicated: Secondary | ICD-10-CM

## 2013-11-05 MED ORDER — BUPROPION HCL ER (SR) 150 MG PO TB12: 150.0000 mg | ORAL_TABLET | Freq: Two times a day (BID) | ORAL | Status: DC

## 2013-11-05 MED ORDER — DOXYCYCLINE HYCLATE 100 MG PO TABS: ORAL_TABLET | ORAL | Status: AC

## 2013-11-05 NOTE — Progress Notes (Signed)
CC: Cynthia Christensen is a 28 y.o. female is here for URI   Subjective: HPI:  Complains of facial pain localized beneath the eyes that is nonradiating, postnasal drip, nonproductive cough, and fatigue. Had chills yesterday but denies fevers or night sweats. Symptoms are moderate in severity have been worsening on daily basis since last week. Slightly improved with leftover Hycodan. Denies wheezing, shortness of breath or chest pain. Other than above nothing seems to make symptoms better or worse. Denies motor or sensory disturbances, rash or any skin changes  Expresses interest in trying to quit smoking. She's not had success with any nicotine supplements in the past. The only progress she's been able to make his switching to pure tobacco without additives. She had hoped to quit by January 1 of 2015.   Review Of Systems Outlined In HPI  Past Medical History  Diagnosis Date  . Tobacco use 05/03/2012    No past surgical history on file. Family History  Problem Relation Age of Onset  . Lung cancer      grandmother  . Heart attack      grandfather  . Thyroid disease Mother     History   Social History  . Marital Status: Single    Spouse Name: N/A    Number of Children: N/A  . Years of Education: N/A   Occupational History  . Not on file.   Social History Main Topics  . Smoking status: Current Every Day Smoker -- 0.50 packs/day for 5 years    Types: Cigarettes  . Smokeless tobacco: Not on file  . Alcohol Use: Yes  . Drug Use: Yes  . Sexual Activity: Yes    Birth Control/ Protection: None   Other Topics Concern  . Not on file   Social History Narrative  . No narrative on file     Objective: BP 119/76  Pulse 66  Temp(Src) 97.9 F (36.6 C) (Oral)  Wt 187 lb (84.823 kg)  General: Alert and Oriented, No Acute Distress HEENT: Pupils equal, round, reactive to light. Conjunctivae clear.  External ears unremarkable, canals clear with intact TMs with appropriate landmarks.   Middle ear appears open without effusion. Pink inferior turbinates.  Moist mucous membranes, pharynx without inflammation nor lesions however moderate post nasal drip.  Neck supple without palpable lymphadenopathy nor abnormal masses. Lungs: Clear to auscultation bilaterally, no wheezing/ronchi/rales.  Comfortable work of breathing. Good air movement. Extremities: No peripheral edema.  Strong peripheral pulses.  Mental Status: No depression, anxiety, nor agitation. Skin: Warm and dry.  Assessment & Plan: Cynthia Christensen was seen today for uri.  Diagnoses and associated orders for this visit:  Tobacco use disorder - buPROPion (WELLBUTRIN SR) 150 MG 12 hr tablet; Take 1 tablet (150 mg total) by mouth 2 (two) times daily. Start 5-7 days before quit date.  Bacterial sinusitis - doxycycline (VIBRA-TABS) 100 MG tablet; One by mouth twice a day for ten days.    Tobacco use: Discussed picking a meaningful quit date in to begin Wellbutrin 5-7 days prior to quit date. Followup with me one month after beginning Wellbutrin. Bacterial sinusitis: Start doxycycline consider Alka-Seltzer cold and sinus and nasal saline washes  25 minutes spent face-to-face during visit today of which at least 50% was counseling or coordinating care regarding: 1. Tobacco use disorder   2. Bacterial sinusitis      Return if symptoms worsen or fail to improve.

## 2014-01-15 ENCOUNTER — Encounter: Payer: Self-pay | Admitting: Family Medicine

## 2014-01-15 ENCOUNTER — Ambulatory Visit (INDEPENDENT_AMBULATORY_CARE_PROVIDER_SITE_OTHER): Payer: BC Managed Care – PPO | Admitting: Family Medicine

## 2014-01-15 VITALS — BP 121/72 | HR 77 | Temp 98.3°F | Ht 62.0 in | Wt 190.0 lb

## 2014-01-15 DIAGNOSIS — J329 Chronic sinusitis, unspecified: Secondary | ICD-10-CM

## 2014-01-15 DIAGNOSIS — A499 Bacterial infection, unspecified: Secondary | ICD-10-CM | POA: Diagnosis not present

## 2014-01-15 DIAGNOSIS — B9689 Other specified bacterial agents as the cause of diseases classified elsewhere: Secondary | ICD-10-CM

## 2014-01-15 MED ORDER — LEVOFLOXACIN 500 MG PO TABS: 500.0000 mg | ORAL_TABLET | Freq: Every day | ORAL | Status: DC

## 2014-01-15 NOTE — Progress Notes (Signed)
CC: [name] is a 28 y.o. female is here for Cough and sinus drainage   Subjective: HPI:   facial pressure in the forehead with postnasal drip and nonproductive cough that has been present for 1 week now worsening on a daily basis. It is worse late in the afternoon and into the evening. It is slightly improved with Mucinex, nothing else seems to make it better or worse. Accompanied by fatigue but denies fevers, chills, night sweats. She denies shortness of breath wheezing or chest pain. Denies any motor or sensory disturbances nor rashes. She relates that symptoms overall are moderate in severity   Review Of Systems Outlined In HPI  Past Medical History  Diagnosis Date  . Tobacco use 05/03/2012    No past surgical history on file. Family History  Problem Relation Age of Onset  . Lung cancer      grandmother  . Heart attack      grandfather  . Thyroid disease Mother     History   Social History  . Marital Status: Single    Spouse Name: N/A    Number of Children: N/A  . Years of Education: N/A   Occupational History  . Not on file.   Social History Main Topics  . Smoking status: Current Every Day Smoker -- 0.50 packs/day for 5 years    Types: Cigarettes  . Smokeless tobacco: Not on file  . Alcohol Use: Yes  . Drug Use: Yes  . Sexual Activity: Yes    Birth Control/ Protection: None   Other Topics Concern  . Not on file   Social History Narrative     Objective: BP 121/72 mmHg  Pulse 77  Temp(Src) 98.3 F (36.8 C)  Ht 5\' 2"  (1.575 m)  Wt 190 lb (86.183 kg)  BMI 34.74 kg/m2  SpO2 98%  General: Alert and Oriented, No Acute Distress HEENT: Pupils equal, round, reactive to light. Conjunctivae clear.  External ears unremarkable, canals clear with intact TMs with appropriate landmarks.  Middle ear appears open without effusion. Pink inferior turbinates.  Moist mucous membranes, pharynx without lesions other than postnasal drip and cobblestoning.  Neck supple  without palpable lymphadenopathy nor abnormal masses. Lungs: Clear to auscultation bilaterally, no wheezing/ronchi/rales.  Comfortable work of breathing. Good air movement. Occasional coughing Extremities: No peripheral edema.  Strong peripheral pulses.  Mental Status: No depression, anxiety, nor agitation. Skin: Warm and dry.  Assessment & Plan: Cynthia Christensen was seen today for cough and sinus drainage.  Diagnoses and associated orders for this visit:  Bacterial sinusitis - levofloxacin (LEVAQUIN) 500 MG tablet; Take 1 tablet (500 mg total) by mouth daily.     bacterial sinusitis: Start levofloxacin and nasal saline washes. Continue Mucinex products.  Return if symptoms worsen or fail to improve.

## 2014-01-21 ENCOUNTER — Encounter: Payer: Self-pay | Admitting: Family Medicine

## 2014-01-21 ENCOUNTER — Ambulatory Visit (INDEPENDENT_AMBULATORY_CARE_PROVIDER_SITE_OTHER): Payer: BC Managed Care – PPO | Admitting: Family Medicine

## 2014-01-21 DIAGNOSIS — M542 Cervicalgia: Secondary | ICD-10-CM | POA: Diagnosis not present

## 2014-01-21 LAB — CBC WITH DIFFERENTIAL/PLATELET
Basophils Absolute: 0 10*3/uL (ref 0.0–0.1)
Basophils Relative: 0 % (ref 0–1)
Eosinophils Absolute: 0.3 10*3/uL (ref 0.0–0.7)
Eosinophils Relative: 3 % (ref 0–5)
HEMATOCRIT: 36.9 % (ref 36.0–46.0)
HEMOGLOBIN: 12.6 g/dL (ref 12.0–15.0)
LYMPHS PCT: 23 % (ref 12–46)
Lymphs Abs: 2.2 10*3/uL (ref 0.7–4.0)
MCH: 31.2 pg (ref 26.0–34.0)
MCHC: 34.1 g/dL (ref 30.0–36.0)
MCV: 91.3 fL (ref 78.0–100.0)
MONO ABS: 0.8 10*3/uL (ref 0.1–1.0)
MONOS PCT: 8 % (ref 3–12)
MPV: 9.7 fL (ref 9.4–12.4)
NEUTROS ABS: 6.2 10*3/uL (ref 1.7–7.7)
NEUTROS PCT: 66 % (ref 43–77)
Platelets: 243 10*3/uL (ref 150–400)
RBC: 4.04 MIL/uL (ref 3.87–5.11)
RDW: 13.1 % (ref 11.5–15.5)
WBC: 9.4 10*3/uL (ref 4.0–10.5)

## 2014-01-21 LAB — POCT RAPID STREP A (OFFICE): Rapid Strep A Screen: NEGATIVE

## 2014-01-21 MED ORDER — AMOXICILLIN-POT CLAVULANATE 875-125 MG PO TABS: 1.0000 | ORAL_TABLET | Freq: Two times a day (BID) | ORAL | Status: DC

## 2014-01-21 MED ORDER — CEPHALEXIN 500 MG PO CAPS: 500.0000 mg | ORAL_CAPSULE | Freq: Four times a day (QID) | ORAL | Status: DC

## 2014-01-21 NOTE — Progress Notes (Signed)
   Subjective:    Patient ID: Cynthia Christensen, female    DOB: 30-Apr-1985, 28 y.o.   MRN: [id number]  HPI seen by dr. Ileene Rubens 01/15/14 for bacterial sinusitis and  she has two days left on ABX. she has noticed some white spots on the back of her throat pain on L side of throat.  She is worried about a salivary gland infection, similar to infection in May. Using IBU for pain and helps some.  She says it is constantly painful. Started last Tuesday and even though her sinus sxs are much better and cough is almost gone her thorat is still very painful. Says it hurts to swallow and feels worse when the turns her head to the left.      Review of Systems     Objective:   Physical Exam  Constitutional: She is oriented to person, place, and time. She appears well-developed and well-nourished.  HENT:  Head: Normocephalic and atraumatic.    Right Ear: External ear normal.  Left Ear: External ear normal.  Nose: Nose normal.  Mouth/Throat: Oropharynx is clear and moist.  TMs and canals are clear.   Eyes: Conjunctivae and EOM are normal. Pupils are equal, round, and reactive to light.  Neck: Neck supple. No thyromegaly present.  Cardiovascular: Normal rate, regular rhythm and normal heart sounds.   Pulmonary/Chest: Effort normal and breath sounds normal. She has no wheezes.  Lymphadenopathy:    She has no cervical adenopathy.  Neurological: She is alert and oriented to person, place, and time.  Skin: Skin is warm and dry.  Psychiatric: She has a normal mood and affect.          Assessment & Plan:  Left sided neck pain - tender over the crease where chin and neck meet.  No palpable glands or hard nodules but very tender. Ear and throat look normal.  Will start kelfex and refer to ENT. Lymphadenitis versus other lesion inside the throat near the tonsils. The oral exam is completely normal and I do not feel any palpable nodes on exam today.  They may be able to scope her if not getting better.

## 2014-01-22 ENCOUNTER — Other Ambulatory Visit: Payer: Self-pay | Admitting: *Deleted

## 2014-02-01 ENCOUNTER — Telehealth: Payer: Self-pay | Admitting: *Deleted

## 2014-02-01 DIAGNOSIS — F17200 Nicotine dependence, unspecified, uncomplicated: Secondary | ICD-10-CM

## 2014-02-01 MED ORDER — BUPROPION HCL ER (SR) 150 MG PO TB12: 150.0000 mg | ORAL_TABLET | Freq: Two times a day (BID) | ORAL | Status: DC

## 2014-02-01 NOTE — Telephone Encounter (Signed)
Pt states she would like to go ahead and start wellbutrin for smoking cessation. She never picked rx up rx'ed in 10/15. She also had questions about side effects. i called her back and left a message that she could either call and ask the pharmacist about side effects prior to picking up rx or leave a message with specific concerns on triage line or my line and we will get back to her

## 2014-05-03 ENCOUNTER — Encounter: Payer: Self-pay | Admitting: *Deleted

## 2014-05-03 ENCOUNTER — Emergency Department (INDEPENDENT_AMBULATORY_CARE_PROVIDER_SITE_OTHER): Payer: BLUE CROSS/BLUE SHIELD

## 2014-05-03 ENCOUNTER — Emergency Department
Admission: EM | Admit: 2014-05-03 | Discharge: 2014-05-03 | Disposition: A | Discharge status: Home / Self Care | Payer: BLUE CROSS/BLUE SHIELD | Source: Home / Self Care | Attending: Emergency Medicine | Admitting: Emergency Medicine

## 2014-05-03 DIAGNOSIS — K5900 Constipation, unspecified: Secondary | ICD-10-CM

## 2014-05-03 DIAGNOSIS — R109 Unspecified abdominal pain: Secondary | ICD-10-CM

## 2014-05-03 DIAGNOSIS — R110 Nausea: Secondary | ICD-10-CM | POA: Diagnosis not present

## 2014-05-03 LAB — POCT URINALYSIS DIP (MANUAL ENTRY)
Bilirubin, UA: NEGATIVE
Blood, UA: NEGATIVE
Glucose, UA: NEGATIVE
Ketones, POC UA: NEGATIVE
Leukocytes, UA: NEGATIVE
Nitrite, UA: NEGATIVE
Protein Ur, POC: NEGATIVE
Spec Grav, UA: 1.015 (ref 1.005–1.03)
Urobilinogen, UA: 0.2 (ref 0–1)
pH, UA: 7 (ref 5–8)

## 2014-05-03 LAB — POCT CBC W AUTO DIFF (K'VILLE URGENT CARE)

## 2014-05-03 LAB — POCT URINE PREGNANCY: Preg Test, Ur: NEGATIVE

## 2014-05-03 MED ORDER — POLYETHYLENE GLYCOL 3350 17 G PO PACK: 17.0000 g | PACK | Freq: Every day | ORAL | Status: DC

## 2014-05-03 NOTE — ED Notes (Signed)
Cynthia Christensen c/o right mid abdominal pain, nausea, loose stools and pain in her side when urinating x 4 days.

## 2014-05-03 NOTE — Discharge Instructions (Signed)

## 2014-05-03 NOTE — ED Provider Notes (Signed)
CSN: [id number]     Arrival date & time 05/03/14  1446 History   First MD Initiated Contact with Patient 05/03/14 1517     Chief Complaint  Patient presents with  . Abdominal Pain   (Consider location/radiation/quality/duration/timing/severity/associated sxs/prior Treatment) Patient is a 29 y.o. female presenting with abdominal pain. The history is provided by the patient. No language interpreter was used.  Abdominal Pain Pain location:  Generalized Pain quality: aching and dull   Pain radiates to:  Does not radiate Pain severity:  Moderate Onset quality:  Gradual Duration:  4 days Timing:  Constant Progression:  Worsening Chronicity:  New Relieved by:  Nothing Worsened by:  Nothing tried Ineffective treatments:  None tried Associated symptoms: nausea   Risk factors: no recent hospitalization     Past Medical History  Diagnosis Date  . Tobacco use 05/03/2012   History reviewed. No pertinent past surgical history. Family History  Problem Relation Age of Onset  . Lung cancer      grandmother  . Heart attack      grandfather  . Thyroid disease Mother    History  Substance Use Topics  . Smoking status: Current Every Day Smoker -- 0.50 packs/day for 5 years    Types: Cigarettes  . Smokeless tobacco: Not on file  . Alcohol Use: Yes   OB History    No data available     Review of Systems  Gastrointestinal: Positive for nausea and abdominal pain.  All other systems reviewed and are negative.   Allergies  Review of patient's allergies indicates no known allergies.  Home Medications   Prior to Admission medications   Medication Sig Start Date End Date Taking? Authorizing Provider  buPROPion (WELLBUTRIN SR) 150 MG 12 hr tablet Take 1 tablet (150 mg total) by mouth 2 (two) times daily. Start 5-7 days before quit date. 02/01/14   Sean Hommel, DO   BP 119/87 mmHg  Pulse 77  Temp(Src) 97.6 F (36.4 C) (Oral)  Resp 14  Wt 179 lb (81.194 kg)  SpO2 100%  LMP  04/09/2014 Physical Exam  Constitutional: She is oriented to person, place, and time. She appears well-developed and well-nourished.  HENT:  Head: Normocephalic and atraumatic.  Eyes: Conjunctivae are normal. Pupils are equal, round, and reactive to light.  Neck: Normal range of motion. Neck supple.  Cardiovascular: Normal rate.   Pulmonary/Chest: Effort normal.  Abdominal: Soft. There is no rebound and no guarding.  Minimally tender diffusely  Musculoskeletal: Normal range of motion.  Neurological: She is oriented to person, place, and time. She has normal reflexes.  Skin: Skin is warm.  Psychiatric: She has a normal mood and affect.  Nursing note and vitals reviewed.   ED Course  Procedures (including critical care time) Labs Review Labs Reviewed  POCT URINALYSIS DIP (MANUAL ENTRY)  POCT CBC W AUTO DIFF ([facility])  preg is negative Ua negative   Imaging Review No results found.  Kub no acute,  Looks like increased stool MDM   1. Constipation, unspecified constipation type    avs Miralax See Dr. Ileene Rubens for recheck if symptoms persist.    Fransico Meadow, PA-C 05/06/14 [place], PA-C 05/06/14 [phone]

## 2014-05-21 ENCOUNTER — Ambulatory Visit (INDEPENDENT_AMBULATORY_CARE_PROVIDER_SITE_OTHER): Payer: BLUE CROSS/BLUE SHIELD | Admitting: Physician Assistant

## 2014-05-21 ENCOUNTER — Encounter: Payer: Self-pay | Admitting: Physician Assistant

## 2014-05-21 VITALS — BP 110/70 | HR 67 | Ht 62.0 in | Wt 175.0 lb

## 2014-05-21 DIAGNOSIS — M545 Low back pain, unspecified: Secondary | ICD-10-CM

## 2014-05-21 DIAGNOSIS — R197 Diarrhea, unspecified: Secondary | ICD-10-CM | POA: Diagnosis not present

## 2014-05-21 DIAGNOSIS — K2100 Gastro-esophageal reflux disease with esophagitis, without bleeding: Secondary | ICD-10-CM

## 2014-05-21 DIAGNOSIS — R109 Unspecified abdominal pain: Secondary | ICD-10-CM

## 2014-05-21 DIAGNOSIS — R112 Nausea with vomiting, unspecified: Secondary | ICD-10-CM

## 2014-05-21 LAB — POCT URINALYSIS DIPSTICK
Bilirubin, UA: NEGATIVE
Glucose, UA: NEGATIVE
Ketones, UA: NEGATIVE
Leukocytes, UA: NEGATIVE
Nitrite, UA: NEGATIVE
PH UA: 7
PROTEIN UA: NEGATIVE
RBC UA: NEGATIVE
Spec Grav, UA: 1.015
Urobilinogen, UA: 0.2

## 2014-05-21 MED ORDER — ONDANSETRON HCL 4 MG PO TABS: 4.0000 mg | ORAL_TABLET | Freq: Three times a day (TID) | ORAL | Status: DC | PRN

## 2014-05-21 MED ORDER — OMEPRAZOLE 40 MG PO CPDR: 40.0000 mg | DELAYED_RELEASE_CAPSULE | Freq: Every day | ORAL | Status: DC

## 2014-05-21 NOTE — Progress Notes (Signed)
   Subjective:    Patient ID: Cynthia Christensen, female    DOB: 1985-07-26, 29 y.o.   MRN: [id number]  HPI Pt presents to the clinic to follow up after [facility] visit of 4/21 approximately 6 days ago with acute vomiting and upper abdominal pain. U?S was negative for any gallbladder issues. cmp and cbc were normal. No cause found. Pt given zofran for nause and does help. Neg UPt. No fever or chills. Husband is starting to have some abdominal cramping today. Started with constipation but now more diarrhea. No blood in stool. No urinary symptoms.    Review of Systems  All other systems reviewed and are negative.      Objective:   Physical Exam  Constitutional: She is oriented to person, place, and time. She appears well-developed and well-nourished.  HENT:  Head: Normocephalic and atraumatic.  Right Ear: External ear normal.  Left Ear: External ear normal.  Nose: Nose normal.  Mouth/Throat: Oropharynx is clear and moist. No oropharyngeal exudate.  Cardiovascular: Normal rate, regular rhythm and normal heart sounds.   Pulmonary/Chest: Effort normal and breath sounds normal. She has no wheezes.  No CVA tenderness.   Abdominal: Soft. Bowel sounds are normal.  Mild tenderness over epigastric and LUQ.   Neurological: She is alert and oriented to person, place, and time.  Skin: Skin is dry.  Psychiatric: She has a normal mood and affect. Her behavior is normal.          Assessment & Plan:  Nausea and vomiting/abominal cramping/discomfort/acid reflux/low back pain- UA dipstick- negative for leuks, nitrates, blood or protein.  Labs normal at [facility] 4/22.  Will test of h.pylori today.  Will get stool cultures.  Given refill zofran.  Given GI cocktail in clinic today.  Omeprazole given daily to start.  Unclear etiology but suspecting gastritis vs gastroenteritis vs h.pylori infection.  Advance diet slowly with BRAT diet.

## 2014-05-21 NOTE — Patient Instructions (Addendum)
Testing for h.pylori. Will get stool cultures.  Start omeprazole.   Gastritis, Adult Gastritis is soreness and swelling (inflammation) of the lining of the stomach. Gastritis can develop as a sudden onset (acute) or long-term (chronic) condition. If gastritis is not treated, it can lead to stomach bleeding and ulcers. CAUSES  Gastritis occurs when the stomach lining is weak or damaged. Digestive juices from the stomach then inflame the weakened stomach lining. The stomach lining may be weak or damaged due to viral or bacterial infections. One common bacterial infection is the Helicobacter pylori infection. Gastritis can also result from excessive alcohol consumption, taking certain medicines, or having too much acid in the stomach.  SYMPTOMS  In some cases, there are no symptoms. When symptoms are present, they may include:  Pain or a burning sensation in the upper abdomen.  Nausea.  Vomiting.  An uncomfortable feeling of fullness after eating. DIAGNOSIS  Your caregiver may suspect you have gastritis based on your symptoms and a physical exam. To determine the cause of your gastritis, your caregiver may perform the following:  Blood or stool tests to check for the H pylori bacterium.  Gastroscopy. A thin, flexible tube (endoscope) is passed down the esophagus and into the stomach. The endoscope has a light and camera on the end. Your caregiver uses the endoscope to view the inside of the stomach.  Taking a tissue sample (biopsy) from the stomach to examine under a microscope. TREATMENT  Depending on the cause of your gastritis, medicines may be prescribed. If you have a bacterial infection, such as an H pylori infection, antibiotics may be given. If your gastritis is caused by too much acid in the stomach, H2 blockers or antacids may be given. Your caregiver may recommend that you stop taking aspirin, ibuprofen, or other nonsteroidal anti-inflammatory drugs (NSAIDs). HOME CARE  INSTRUCTIONS  Only take over-the-counter or prescription medicines as directed by your caregiver.  If you were given antibiotic medicines, take them as directed. Finish them even if you start to feel better.  Drink enough fluids to keep your urine clear or pale yellow.  Avoid foods and drinks that make your symptoms worse, such as:  Caffeine or alcoholic drinks.  Chocolate.  Peppermint or mint flavorings.  Garlic and onions.  Spicy foods.  Citrus fruits, such as oranges, lemons, or limes.  Tomato-based foods such as sauce, chili, salsa, and pizza.  Fried and fatty foods.  Eat small, frequent meals instead of large meals. SEEK IMMEDIATE MEDICAL CARE IF:   You have black or dark red stools.  You vomit blood or material that looks like coffee grounds.  You are unable to keep fluids down.  Your abdominal pain gets worse.  You have a fever.  You do not feel better after 1 week.  You have any other questions or concerns. MAKE SURE YOU:  Understand these instructions.  Will watch your condition.  Will get help right away if you are not doing well or get worse. Document Released: 01/05/2001 Document Revised: 07/13/2011 Document Reviewed: 02/24/2011 [facility] Patient Information 2015 [place], [place]. This information is not intended to replace advice given to you by your health care provider. Make sure you discuss any questions you have with your health care provider. Food Choices to Help Relieve Diarrhea When you have diarrhea, the foods you eat and your eating habits are very important. Choosing the right foods and drinks can help relieve diarrhea. Also, because diarrhea can last up to 7 days, you need to  replace lost fluids and electrolytes (such as sodium, potassium, and chloride) in order to help prevent dehydration.  WHAT GENERAL GUIDELINES DO I NEED TO FOLLOW?  Slowly drink 1 cup (8 oz) of fluid for each episode of diarrhea. If you are getting enough fluid, your  urine will be clear or pale yellow.  Eat starchy foods. Some good choices include white rice, white toast, pasta, low-fiber cereal, baked potatoes (without the skin), saltine crackers, and bagels.  Avoid large servings of any cooked vegetables.  Limit fruit to two servings per day. A serving is  cup or 1 small piece.  Choose foods with less than 2 g of fiber per serving.  Limit fats to less than 8 tsp (38 g) per day.  Avoid fried foods.  Eat foods that have probiotics in them. Probiotics can be found in certain dairy products.  Avoid foods and beverages that may increase the speed at which food moves through the stomach and intestines (gastrointestinal tract). Things to avoid include:  High-fiber foods, such as dried fruit, raw fruits and vegetables, nuts, seeds, and whole grain foods.  Spicy foods and high-fat foods.  Foods and beverages sweetened with high-fructose corn syrup, honey, or sugar alcohols such as xylitol, sorbitol, and mannitol. WHAT FOODS ARE RECOMMENDED? Grains White rice. White, [place], or pita breads (fresh or toasted), including plain rolls, buns, or bagels. White pasta. Saltine, soda, or graham crackers. Pretzels. Low-fiber cereal. Cooked cereals made with water (such as cornmeal, farina, or cream cereals). Plain muffins. Matzo. Melba toast. Zwieback.  Vegetables Potatoes (without the skin). Strained tomato and vegetable juices. Most well-cooked and canned vegetables without seeds. Tender lettuce. Fruits Cooked or canned applesauce, apricots, cherries, fruit cocktail, grapefruit, peaches, pears, or plums. Fresh bananas, apples without skin, cherries, grapes, cantaloupe, grapefruit, peaches, oranges, or plums.  Meat and Other Protein Products Baked or boiled chicken. Eggs. Tofu. Fish. Seafood. Smooth peanut butter. Ground or well-cooked tender beef, ham, veal, lamb, pork, or poultry.  Dairy Plain yogurt, kefir, and unsweetened liquid yogurt. Lactose-free milk,  buttermilk, or soy milk. Plain hard cheese. Beverages Sport drinks. Clear broths. Diluted fruit juices (except prune). Regular, caffeine-free sodas such as ginger ale. Water. Decaffeinated teas. Oral rehydration solutions. Sugar-free beverages not sweetened with sugar alcohols. Other Bouillon, broth, or soups made from recommended foods.  The items listed above may not be a complete list of recommended foods or beverages. Contact your dietitian for more options. WHAT FOODS ARE NOT RECOMMENDED? Grains Whole grain, whole wheat, bran, or rye breads, rolls, pastas, crackers, and cereals. Wild or [place]ch rice. Cereals that contain more than 2 g of fiber per serving. Corn tortillas or taco shells. Cooked or dry oatmeal. Granola. Popcorn. Vegetables Raw vegetables. Cabbage, broccoli, Brussels sprouts, artichokes, baked beans, beet greens, corn, kale, legumes, peas, sweet potatoes, and yams. Potato skins. Cooked spinach and cabbage. Fruits Dried fruit, including raisins and dates. Raw fruits. Stewed or dried prunes. Fresh apples with skin, apricots, mangoes, pears, raspberries, and strawberries.  Meat and Other Protein Products Chunky peanut butter. Nuts and seeds. Beans and lentils. Berniece Salines.  Dairy High-fat cheeses. Milk, chocolate milk, and beverages made with milk, such as milk shakes. Cream. Ice cream. Sweets and Desserts Sweet rolls, doughnuts, and sweet breads. Pancakes and waffles. Fats and Oils Butter. Cream sauces. Margarine. Salad oils. Plain salad dressings. Olives. Avocados.  Beverages Caffeinated beverages (such as coffee, tea, soda, or energy drinks). Alcoholic beverages. Fruit juices with pulp. Prune juice. Soft drinks sweetened with high-fructose corn  syrup or sugar alcohols. Other Coconut. Hot sauce. Chili powder. Mayonnaise. Gravy. Cream-based or milk-based soups.  The items listed above may not be a complete list of foods and beverages to avoid. Contact your dietitian for more  information. WHAT SHOULD I DO IF I BECOME DEHYDRATED? Diarrhea can sometimes lead to dehydration. Signs of dehydration include dark urine and dry mouth and skin. If you think you are dehydrated, you should rehydrate with an oral rehydration solution. These solutions can be purchased at pharmacies, retail stores, or online.  Drink -1 cup (120-240 mL) of oral rehydration solution each time you have an episode of diarrhea. If drinking this amount makes your diarrhea worse, try drinking smaller amounts more often. For example, drink 1-3 tsp (5-15 mL) every 5-10 minutes.  A general rule for staying hydrated is to drink 1-2 L of fluid per day. Talk to your health care provider about the specific amount you should be drinking each day. Drink enough fluids to keep your urine clear or pale yellow. Document Released: 04/03/2003 Document Revised: 01/16/2013 Document Reviewed: 12/04/2012 [facility] Patient Information 2015 [place], [place]. This information is not intended to replace advice given to you by your health care provider. Make sure you discuss any questions you have with your health care provider.

## 2014-05-22 LAB — OVA AND PARASITE EXAMINATION: OP: NONE SEEN

## 2014-05-22 LAB — C. DIFFICILE GDH AND TOXIN A/B
C. difficile GDH: NOT DETECTED
C. difficile Toxin A/B: NOT DETECTED

## 2014-05-22 LAB — H. PYLORI BREATH TEST: H. pylori Breath Test: NOT DETECTED

## 2014-05-23 ENCOUNTER — Telehealth: Payer: Self-pay | Admitting: Physician Assistant

## 2014-05-23 ENCOUNTER — Other Ambulatory Visit: Payer: Self-pay | Admitting: Physician Assistant

## 2014-05-23 MED ORDER — FLUCONAZOLE 200 MG PO TABS: 200.0000 mg | ORAL_TABLET | ORAL | Status: DC

## 2014-05-23 NOTE — Telephone Encounter (Signed)
Can we call pt and see how symptoms are today?

## 2014-05-23 NOTE — Telephone Encounter (Signed)
I spoke with her this morning to give results to her.  She still feels a little nauseated.

## 2014-05-25 LAB — STOOL CULTURE

## 2015-03-17 ENCOUNTER — Ambulatory Visit (INDEPENDENT_AMBULATORY_CARE_PROVIDER_SITE_OTHER): Payer: BLUE CROSS/BLUE SHIELD | Admitting: Family Medicine

## 2015-03-17 ENCOUNTER — Encounter: Payer: Self-pay | Admitting: Family Medicine

## 2015-03-17 VITALS — BP 128/86 | HR 80 | Temp 97.5°F | Wt 169.0 lb

## 2015-03-17 DIAGNOSIS — J329 Chronic sinusitis, unspecified: Secondary | ICD-10-CM | POA: Diagnosis not present

## 2015-03-17 DIAGNOSIS — A499 Bacterial infection, unspecified: Secondary | ICD-10-CM | POA: Diagnosis not present

## 2015-03-17 DIAGNOSIS — B9689 Other specified bacterial agents as the cause of diseases classified elsewhere: Secondary | ICD-10-CM

## 2015-03-17 MED ORDER — AMOXICILLIN-POT CLAVULANATE 500-125 MG PO TABS: ORAL_TABLET | ORAL | Status: AC

## 2015-03-17 NOTE — Progress Notes (Signed)
CC: Cynthia Christensen is a 30 y.o. female is here for Sinusitis   Subjective: HPI:  1 week of facial pressure in the forehead and left cheek. According nasal congestion, nasal drip and nonproductive cough. Symptoms are moderate severity. Worse at night. No benefit from Claritin or Mucinex. She had chills yesterday and today is having body aches. She denies focal joint pain or weakness. Denies fevers, wheezing or shortness of breath. No rash   Review Of Systems Outlined In HPI  Past Medical History  Diagnosis Date  . Tobacco use 05/03/2012    No past surgical history on file. Family History  Problem Relation Age of Onset  . Lung cancer      grandmother  . Heart attack      grandfather  . Thyroid disease Mother     Social History   Social History  . Marital Status: Married    Spouse Name: N/A  . Number of Children: N/A  . Years of Education: N/A   Occupational History  . Not on file.   Social History Main Topics  . Smoking status: Current Every Day Smoker -- 0.50 packs/day for 5 years    Types: Cigarettes  . Smokeless tobacco: Not on file  . Alcohol Use: Yes  . Drug Use: Yes  . Sexual Activity: Yes    Birth Control/ Protection: None   Other Topics Concern  . Not on file   Social History Narrative     Objective: BP 128/86 mmHg  Pulse 80  Temp(Src) 97.5 F (36.4 C) (Oral)  Wt 169 lb (76.658 kg)  General: Alert and Oriented, No Acute Distress HEENT: Pupils equal, round, reactive to light. Conjunctivae clear.  External ears unremarkable, canals clear with intact TMs with appropriate landmarks.  Middle ear appears open without effusion. Pink inferior turbinates.  Moist mucous membranes, pharynx without inflammation nor lesions other than moderate cobblestoning.  Neck supple without palpable lymphadenopathy nor abnormal masses. Lungs: Clear to auscultation bilaterally, no wheezing/ronchi/rales.  Comfortable work of breathing. Good air movement. Extremities: No peripheral  edema.  Strong peripheral pulses.  Mental Status: No depression, anxiety, nor agitation. Skin: Warm and dry.  Assessment & Plan: Edrie was seen today for sinusitis.  Diagnoses and all orders for this visit:  Bacterial sinusitis -     amoxicillin-clavulanate (AUGMENTIN) 500-125 MG tablet; Take one by mouth every 8 hours for ten total days.   Start Augmentin and consider nasal saline washes. Call if no better by Wednesday  Return if symptoms worsen or fail to improve.

## 2015-06-12 DIAGNOSIS — D485 Neoplasm of uncertain behavior of skin: Secondary | ICD-10-CM | POA: Diagnosis not present

## 2015-06-12 DIAGNOSIS — L732 Hidradenitis suppurativa: Secondary | ICD-10-CM | POA: Diagnosis not present

## 2015-06-12 DIAGNOSIS — D224 Melanocytic nevi of scalp and neck: Secondary | ICD-10-CM | POA: Diagnosis not present

## 2015-06-25 ENCOUNTER — Encounter: Payer: Self-pay | Admitting: Obstetrics & Gynecology

## 2015-06-25 ENCOUNTER — Ambulatory Visit (INDEPENDENT_AMBULATORY_CARE_PROVIDER_SITE_OTHER): Payer: BLUE CROSS/BLUE SHIELD | Admitting: Obstetrics & Gynecology

## 2015-06-25 VITALS — BP 133/90 | HR 99 | Ht 61.75 in | Wt 178.0 lb

## 2015-06-25 DIAGNOSIS — Z3161 Procreative counseling and advice using natural family planning: Secondary | ICD-10-CM

## 2015-06-25 DIAGNOSIS — E7800 Pure hypercholesterolemia, unspecified: Secondary | ICD-10-CM | POA: Diagnosis not present

## 2015-06-25 NOTE — Progress Notes (Signed)
Patient quit using Nuva ring two months ago and trying to concieve. Patient states she is up to date on her pap smear. Kathrene Alu RN BSN  Pt would like to conceive.  She has been off birth control for one cycle (due for menses in 2 weeks for 2nd cycle.  Pt tracking ovulation with temperature and OTC ovulation kits.    Review of reproductive history as follows: Menarche 16 with regular menses through teens.   Sporatic OCP use through teens and 75s. TOP x1 at age 62 (5-6 weeks, no complications) No history of PID, or STD Abnml pap x1 with all normal past 5+ years Pt has menses q28-31 days Flow last 5 days, not heavy No spotting b/t periods Husband--no known children  Past Medical History  Diagnosis Date  . Tobacco use 05/03/2012  . History of HPV infection    History reviewed. No pertinent past surgical history.  +smoker--encouraged to quit. Husband smokes too  TSH nml Needs rpt cholesterol panel (high 3 years ago) CF testing PNV  Pt to have time intercourse every other day RTC  Months if no pregnancy. SMOKING CESSATION! (patches safe and encouraged) Continue weight loss and exercise Zika exposure via reading material  30 minutes spent face to face with patient with greater than 50% counseling

## 2015-06-27 ENCOUNTER — Encounter: Payer: Self-pay | Admitting: Family Medicine

## 2015-06-27 DIAGNOSIS — D229 Melanocytic nevi, unspecified: Secondary | ICD-10-CM | POA: Insufficient documentation

## 2015-07-16 ENCOUNTER — Other Ambulatory Visit (INDEPENDENT_AMBULATORY_CARE_PROVIDER_SITE_OTHER): Payer: BLUE CROSS/BLUE SHIELD

## 2015-07-16 DIAGNOSIS — Z01419 Encounter for gynecological examination (general) (routine) without abnormal findings: Secondary | ICD-10-CM | POA: Diagnosis not present

## 2015-07-16 DIAGNOSIS — E7800 Pure hypercholesterolemia, unspecified: Secondary | ICD-10-CM | POA: Diagnosis not present

## 2015-07-16 LAB — COMPREHENSIVE METABOLIC PANEL
ALBUMIN: 3.8 g/dL (ref 3.6–5.1)
ALT: 28 U/L (ref 6–29)
AST: 25 U/L (ref 10–30)
Alkaline Phosphatase: 77 U/L (ref 33–115)
BUN: 12 mg/dL (ref 7–25)
CALCIUM: 8.4 mg/dL — AB (ref 8.6–10.2)
CO2: 20 mmol/L (ref 20–31)
Chloride: 108 mmol/L (ref 98–110)
Creat: 0.6 mg/dL (ref 0.50–1.10)
GLUCOSE: 77 mg/dL (ref 65–99)
POTASSIUM: 4.6 mmol/L (ref 3.5–5.3)
Sodium: 138 mmol/L (ref 135–146)
Total Bilirubin: 0.3 mg/dL (ref 0.2–1.2)
Total Protein: 6.1 g/dL (ref 6.1–8.1)

## 2015-07-16 LAB — TSH: TSH: 1.18 m[IU]/L

## 2015-07-16 LAB — CBC
HEMATOCRIT: 39.1 % (ref 35.0–45.0)
HEMOGLOBIN: 12.9 g/dL (ref 11.7–15.5)
MCH: 31.1 pg (ref 27.0–33.0)
MCHC: 33 g/dL (ref 32.0–36.0)
MCV: 94.2 fL (ref 80.0–100.0)
MPV: 9.6 fL (ref 7.5–12.5)
Platelets: 219 10*3/uL (ref 140–400)
RBC: 4.15 MIL/uL (ref 3.80–5.10)
RDW: 13.3 % (ref 11.0–15.0)
WBC: 9.1 10*3/uL (ref 3.8–10.8)

## 2015-07-16 LAB — LIPID PANEL
Cholesterol: 209 mg/dL — ABNORMAL HIGH (ref 125–200)
HDL: 32 mg/dL — ABNORMAL LOW (ref >=46)
LDL CALC: 127 mg/dL (ref <130)
Total CHOL/HDL Ratio: 6.5 Ratio — ABNORMAL HIGH (ref <=5.0)
Triglycerides: 249 mg/dL — ABNORMAL HIGH (ref <150)
VLDL: 50 mg/dL — ABNORMAL HIGH (ref <30)

## 2015-07-18 LAB — CYSTIC FIBROSIS DIAGNOSTIC STUDY

## 2015-07-21 ENCOUNTER — Telehealth: Payer: Self-pay | Admitting: *Deleted

## 2015-07-21 NOTE — Telephone Encounter (Signed)
Pt notified of lab results and she is to contact Dr Hommel's office for f/u of lipids.

## 2015-07-21 NOTE — Telephone Encounter (Signed)
-----   Message from Guss Bunde, MD sent at 07/19/2015  6:01 AM EDT ----- Hypertriglyceridemia and hypercholesterolemia.  Refer back to [facility]) for recommendations and health optimization before pregnancy.

## 2015-07-22 ENCOUNTER — Encounter: Payer: Self-pay | Admitting: Family Medicine

## 2015-07-22 ENCOUNTER — Ambulatory Visit (INDEPENDENT_AMBULATORY_CARE_PROVIDER_SITE_OTHER): Payer: BLUE CROSS/BLUE SHIELD | Admitting: Family Medicine

## 2015-07-22 VITALS — BP 118/81 | HR 56 | Wt 180.0 lb

## 2015-07-22 DIAGNOSIS — E781 Pure hyperglyceridemia: Secondary | ICD-10-CM

## 2015-07-22 DIAGNOSIS — E785 Hyperlipidemia, unspecified: Secondary | ICD-10-CM | POA: Diagnosis not present

## 2015-07-22 MED ORDER — EQL FISH OIL 1000 MG PO CAPS: ORAL_CAPSULE | ORAL | Status: DC

## 2015-07-22 NOTE — Progress Notes (Signed)
CC: Cynthia Christensen is a 30 y.o. female is here for discuss labs   Subjective: HPI:  Earlier this month she was found to have a mildly elevated LDL and a moderately elevated triglyceride level. She is under the impression that this is threatening a possible pregnancy. She wants to know what she needs to do to help get this under control. She denies any personal history of cardiovascular disease. She denies any chest pain but does have occasional shortness of breath when anxious. She is already tried to reduce fat intake in her diet. She is already started to exercise a little bit more. She's also started taking prenatal vitamins with EPA.     Review Of Systems Outlined In HPI  Past Medical History  Diagnosis Date  . Tobacco use 05/03/2012  . History of HPV infection     No past surgical history on file. Family History  Problem Relation Age of Onset  . Lung cancer      grandmother  . Heart attack      grandfather  . Thyroid disease Mother     Social History   Social History  . Marital Status: Married    Spouse Name: N/A  . Number of Children: N/A  . Years of Education: N/A   Occupational History  . Not on file.   Social History Main Topics  . Smoking status: Current Every Day Smoker -- 0.50 packs/day for 5 years    Types: Cigarettes  . Smokeless tobacco: Not on file  . Alcohol Use: Yes  . Drug Use: Yes  . Sexual Activity: Yes    Birth Control/ Protection: None   Other Topics Concern  . Not on file   Social History Narrative     Objective: BP 118/81 mmHg  Pulse 56  Wt 180 lb (81.647 kg)  LMP 06/05/2015  Vital signs reviewed. General: Alert and Oriented, No Acute Distress HEENT: Pupils equal, round, reactive to light. Conjunctivae clear.  External ears unremarkable.  Moist mucous membranes. Lungs: Clear and comfortable work of breathing, speaking in full sentences without accessory muscle use. Cardiac: Regular rate and rhythm.  Neuro: CN II-XII grossly intact,  gait normal. Extremities: No peripheral edema.  Strong peripheral pulses.  Mental Status: No depression, anxiety, nor agitation. Logical though process. Skin: Warm and dry.  Assessment & Plan: Cynthia Christensen was seen today for discuss labs.  Diagnoses and all orders for this visit:  Hyperlipidemia  Hypertriglyceridemia   I discussed with her that technically her LDL cholesterol is under control given her age and lack of cardiac risk factors. Her hypertriglyceridemia and low HDL should be focused on the most at this point. I discussed that if she takes a fish oral supplement in the evening there is a high likelihood that her triglycerides will improve and HDL will increase. Additionally decreasing alcohol consumption and increasing physical activity will help with this as well. She was incredibly anxious at the beginning of encounter but pleased with the news on my interpretation of her labs and how we will proceed from here.  25 minutes spent face-to-face during visit today of which at least 50% was counseling or coordinating care regarding: 1. Hyperlipidemia   2. Hypertriglyceridemia      Return in about 4 weeks (around 08/19/2015) for Cholesterol Recheck.

## 2015-08-21 ENCOUNTER — Ambulatory Visit: Payer: BLUE CROSS/BLUE SHIELD | Admitting: Family Medicine

## 2015-09-17 ENCOUNTER — Ambulatory Visit (INDEPENDENT_AMBULATORY_CARE_PROVIDER_SITE_OTHER): Payer: BLUE CROSS/BLUE SHIELD | Admitting: Family Medicine

## 2015-09-17 ENCOUNTER — Encounter: Payer: Self-pay | Admitting: Family Medicine

## 2015-09-17 VITALS — BP 108/74 | HR 60 | Wt 181.0 lb

## 2015-09-17 DIAGNOSIS — E785 Hyperlipidemia, unspecified: Secondary | ICD-10-CM

## 2015-09-17 LAB — LIPID PANEL
CHOLESTEROL: 212 mg/dL — AB (ref 125–200)
HDL: 31 mg/dL — ABNORMAL LOW (ref >=46)
LDL Cholesterol: 151 mg/dL — ABNORMAL HIGH (ref <130)
TRIGLYCERIDES: 152 mg/dL — AB (ref <150)
Total CHOL/HDL Ratio: 6.8 Ratio — ABNORMAL HIGH (ref <=5.0)
VLDL: 30 mg/dL (ref <30)

## 2015-09-17 NOTE — Progress Notes (Signed)
CC: Cynthia Christensen is a 30 y.o. female is here for f/u cholesterol   Subjective: HPI:  Follow-up hyperlipidemia: She is taking 1000 mg capsule of fish oil on a daily basis. She denies any known side effects. She denies any chest pain limb claudication or abdominal pain. She denies any epigastric pain or muscle aches.she cut back on triglycerides and alcohol intake   Review Of Systems Outlined In HPI  Past Medical History:  Diagnosis Date  . History of HPV infection   . Tobacco use 05/03/2012    No past surgical history on file. Family History  Problem Relation Age of Onset  . Lung cancer      grandmother  . Heart attack      grandfather  . Thyroid disease Mother     Social History   Social History  . Marital status: Married    Spouse name: N/A  . Number of children: N/A  . Years of education: N/A   Occupational History  . Not on file.   Social History Main Topics  . Smoking status: Current Every Day Smoker    Packs/day: 0.50    Years: 5.00    Types: Cigarettes  . Smokeless tobacco: Not on file  . Alcohol use Yes  . Drug use:   . Sexual activity: Yes    Birth control/ protection: None   Other Topics Concern  . Not on file   Social History Narrative  . No narrative on file     Objective: BP 108/74   Pulse 60   Wt 181 lb (82.1 kg)   BMI 33.37 kg/m   Vital signs reviewed. General: Alert and Oriented, No Acute Distress HEENT: Pupils equal, round, reactive to light. Conjunctivae clear.  External ears unremarkable.  Moist mucous membranes. Lungs: Clear and comfortable work of breathing, speaking in full sentences without accessory muscle use. Cardiac: Regular rate and rhythm.  Neuro: CN II-XII grossly intact, gait normal. Extremities: No peripheral edema.  Strong peripheral pulses.  Mental Status: No depression, anxiety, nor agitation. Logical though process. Skin: Warm and dry.  Assessment & Plan: Cynthia Christensen was seen today for f/u cholesterol.  Diagnoses and  all orders for this visit:  Hyperlipidemia -     Lipid panel   Continue fish oil supplement pending above results  Discussed with this patient that I will be resigning from my position here with [facility] in September in order to stay with my family who will be moving to [facility]. I let him know about the providers that are still accepting patients and I feel that this individual will be under great care if he/she stays here with [facility].  Return in about 3 months (around 12/18/2015), or if symptoms worsen or fail to improve.

## 2015-09-19 ENCOUNTER — Ambulatory Visit (INDEPENDENT_AMBULATORY_CARE_PROVIDER_SITE_OTHER): Payer: BLUE CROSS/BLUE SHIELD | Admitting: Physician Assistant

## 2015-09-19 ENCOUNTER — Ambulatory Visit: Payer: BLUE CROSS/BLUE SHIELD | Admitting: Family Medicine

## 2015-09-19 ENCOUNTER — Encounter: Payer: Self-pay | Admitting: Physician Assistant

## 2015-09-19 ENCOUNTER — Telehealth: Payer: Self-pay | Admitting: Emergency Medicine

## 2015-09-19 VITALS — BP 119/70 | HR 65 | Ht 61.75 in | Wt 184.0 lb

## 2015-09-19 DIAGNOSIS — K130 Diseases of lips: Secondary | ICD-10-CM | POA: Diagnosis not present

## 2015-09-19 DIAGNOSIS — R220 Localized swelling, mass and lump, head: Secondary | ICD-10-CM | POA: Diagnosis not present

## 2015-09-19 DIAGNOSIS — T781XXA Other adverse food reactions, not elsewhere classified, initial encounter: Secondary | ICD-10-CM | POA: Insufficient documentation

## 2015-09-19 MED ORDER — METHYLPREDNISOLONE SODIUM SUCC 125 MG IJ SOLR
125.0000 mg | Freq: Once | INTRAMUSCULAR | Status: AC
  Administered 2015-09-19: 125 mg via INTRAMUSCULAR

## 2015-09-19 MED ORDER — EPINEPHRINE 0.15 MG/0.3ML IJ SOAJ: 0.1500 mg | INTRAMUSCULAR | 0 refills | Status: DC | PRN

## 2015-09-19 MED ORDER — PREDNISONE 50 MG PO TABS: ORAL_TABLET | ORAL | 0 refills | Status: DC

## 2015-09-19 NOTE — Progress Notes (Signed)
Message from patient stating she awoke from nap at 1600 and noticed swelling of lower lip; she said the other areas of swelling have gone down. I called and was sent to [facility]: left message for her to call us asap to rule out dyspnea or oral blockage; if she gets message after we close, call 911 or go to [facility].

## 2015-09-19 NOTE — Progress Notes (Signed)
   Subjective:    Patient ID: Cynthia Christensen, female    DOB: Feb 11, 1985, 30 y.o.   MRN: [id number]  HPI Patient is a 30 year old female who presents to the clinic with an allergic response to unknown substance. She's had 2 of these response the last 6 months. The first she thought was to grapes. She was washing grapes she fell her tongue swelling and became itchy all over. Since then she has eaten grapes but never had the response. Yesterday she was washing a peach and after ate the peach.  She had similar sensations. She started itching all over. Her tongue felt like it was swelling. She also had burning sensation in her stomach. She does not notice a rash but continues to itch. She denies any problems breathing, wheezing or swallowing difficulties. She continues to feel like tongue is swollen. Any fever, chills, nausea or vomiting. Her abdominal pain has resolved. She is taking Benadryl 25 mg 3 times a day. It has helped some with the itching.   Review of Systems  see history of present illness.     Objective:   Physical Exam  Constitutional: She appears well-developed and well-nourished.  HENT:  Head: Normocephalic and atraumatic.  Right Ear: External ear normal.  Left Ear: External ear normal.  Nose: Nose normal.  Mouth/Throat: Oropharynx is clear and moist. No oropharyngeal exudate.  Blanched swollen lower lip. (at second visit same day)   Neck: Normal range of motion. Neck supple.  Cardiovascular: Normal rate, regular rhythm and normal heart sounds.   Pulmonary/Chest: Effort normal and breath sounds normal. She has no wheezes.  Lymphadenopathy:    She has no cervical adenopathy.  Skin:  No rash noted today.   Psychiatric: She has a normal mood and affect. Her behavior is normal.          Assessment & Plan:  Allergic response to food/Tongue swelling-reassured patient that I do not see any worrisome signs today. Site Medrol 125 MG was given in office today. She was given a  prescription for prednisone 50 mg for 5 days. Continue to take Benadryl 50 mg up to 3 times a day as needed. Sedation warning given. Unclear exact etiology. Patient has an allergist appointment made for next week. Not sure if this is actual food she is allergic to or a pesticide on the food. I would keep a food diary and if these episodes happen again. Follow-up as needed.   Patient came back again at the end of clinic today with lower lip swelling, tightness and discomfort. She denies any problems breathing. She denies any wheezing or shortness of breath. She denies any fever, chills, nausea or vomiting. Advised her to pick up the prednisone and start prednisone for the next 5 days. Unclear what this could be associated with. Continue to keep appointment with allergist. Certainly lip swelling is concerning that could represent the start of anaphylaxis. EpiPen was sent to the pharmacy to keep on hand if she develops symptoms of shortness of breath, difficulty swallowing, more tongue swelling. She can take ibuprofen up to 800 mg up to 3 times a day for any discomfort. Can consider icing. Follow-up as needed.

## 2015-09-19 NOTE — Patient Instructions (Signed)

## 2015-09-19 NOTE — Progress Notes (Signed)
Spoke with [employer] about telephone message from patient and inablility to get back in touch; she states patient came to office in interim and has been evaluated. pak

## 2015-09-30 DIAGNOSIS — L299 Pruritus, unspecified: Secondary | ICD-10-CM | POA: Diagnosis not present

## 2015-09-30 DIAGNOSIS — J3089 Other allergic rhinitis: Secondary | ICD-10-CM | POA: Diagnosis not present

## 2015-09-30 DIAGNOSIS — R05 Cough: Secondary | ICD-10-CM | POA: Diagnosis not present

## 2015-09-30 DIAGNOSIS — R600 Localized edema: Secondary | ICD-10-CM | POA: Diagnosis not present

## 2015-09-30 DIAGNOSIS — Z79899 Other long term (current) drug therapy: Secondary | ICD-10-CM | POA: Diagnosis not present

## 2015-09-30 DIAGNOSIS — R609 Edema, unspecified: Secondary | ICD-10-CM | POA: Diagnosis not present

## 2015-09-30 DIAGNOSIS — J40 Bronchitis, not specified as acute or chronic: Secondary | ICD-10-CM | POA: Diagnosis not present

## 2015-09-30 DIAGNOSIS — Z7951 Long term (current) use of inhaled steroids: Secondary | ICD-10-CM | POA: Diagnosis not present

## 2015-09-30 DIAGNOSIS — L509 Urticaria, unspecified: Secondary | ICD-10-CM | POA: Diagnosis not present

## 2015-09-30 DIAGNOSIS — J383 Other diseases of vocal cords: Secondary | ICD-10-CM | POA: Diagnosis not present

## 2015-09-30 DIAGNOSIS — J302 Other seasonal allergic rhinitis: Secondary | ICD-10-CM | POA: Diagnosis not present

## 2015-11-08 ENCOUNTER — Other Ambulatory Visit: Payer: Self-pay | Admitting: Physician Assistant

## 2015-11-27 ENCOUNTER — Ambulatory Visit (INDEPENDENT_AMBULATORY_CARE_PROVIDER_SITE_OTHER): Payer: BLUE CROSS/BLUE SHIELD | Admitting: Osteopathic Medicine

## 2015-11-27 ENCOUNTER — Encounter (INDEPENDENT_AMBULATORY_CARE_PROVIDER_SITE_OTHER): Payer: Self-pay

## 2015-11-27 ENCOUNTER — Encounter: Payer: Self-pay | Admitting: Osteopathic Medicine

## 2015-11-27 ENCOUNTER — Ambulatory Visit (INDEPENDENT_AMBULATORY_CARE_PROVIDER_SITE_OTHER): Payer: BLUE CROSS/BLUE SHIELD

## 2015-11-27 VITALS — BP 135/85 | HR 68 | Ht 62.0 in | Wt 188.0 lb

## 2015-11-27 DIAGNOSIS — N83201 Unspecified ovarian cyst, right side: Secondary | ICD-10-CM | POA: Diagnosis not present

## 2015-11-27 DIAGNOSIS — R102 Pelvic and perineal pain: Secondary | ICD-10-CM | POA: Diagnosis not present

## 2015-11-27 DIAGNOSIS — R1031 Right lower quadrant pain: Secondary | ICD-10-CM

## 2015-11-27 LAB — POCT URINALYSIS DIPSTICK
Bilirubin, UA: NEGATIVE
Glucose, UA: NEGATIVE
KETONES UA: NEGATIVE
Leukocytes, UA: NEGATIVE
Nitrite, UA: NEGATIVE
PROTEIN UA: NEGATIVE
RBC UA: NEGATIVE
SPEC GRAV UA: 1.01
Urobilinogen, UA: 0.2
pH, UA: 6

## 2015-11-27 LAB — POCT URINE PREGNANCY: Preg Test, Ur: NEGATIVE

## 2015-11-27 NOTE — Progress Notes (Signed)
HPI: Cynthia Christensen is a 30 y.o. female  who presents to [facility] today, 11/27/15,  for chief complaint of:  Chief Complaint  Patient presents with  . Abdominal Pain    Patient presents with lower abdominal/pelvic pain, thinks may be due to a UTI based on its location . Location: Right lower quadrant/right pelvis . Quality: Dull with intermittent sharp/crampy pain, seems worse as the day goes on, feels a bit worse with urination but has no hematuria/dysuria . Duration: 5 days total  . Modifying factors: Currently on antibiotics for "blocked sweat glands" . Assoc signs/symptoms: No nausea/vomiting, no fever, no diarrhea or constipation, no unusual vaginal bleeding or discharge  .   Past medical, surgical, social and family history reviewed: Past Medical History:  Diagnosis Date  . History of HPV infection   . Tobacco use 05/03/2012   No past surgical history on file. Social History  Substance Use Topics  . Smoking status: Current Every Day Smoker    Packs/day: 0.50    Years: 5.00    Types: Cigarettes  . Smokeless tobacco: Not on file  . Alcohol use Yes   Family History  Problem Relation Age of Onset  . Lung cancer      grandmother  . Heart attack      grandfather  . Thyroid disease Mother      Current medication list and allergy/intolerance information reviewed:   Current Outpatient Prescriptions  Medication Sig Dispense Refill  . EPINEPHRINE 0.15 MG/0.15ML IJ injection INJECT 0.3 MLS (0.15 MG TOTAL) INTO THE MUSCLE AS NEEDED FOR ANAPHYLAXIS. 2 each 0  . Omega-3 Fatty Acids (EQL FISH OIL) 1000 MG CAPS 1 by mouth in the evening. 90 each   . Prenatal Vit-Fe Fumarate-FA (PRENATAL COMPLETE PO) Take by mouth.     No current facility-administered medications for this visit.    No Known Allergies    Review of Systems:  Constitutional:  No  fever, no chills, No recent illness, No unintentional weight changes.  HEENT: No   headache  Cardiac: No  chest pain  Respiratory:  No  shortness of breath  Gastrointestinal: +abdominal pain, No  nausea, No  vomiting,  No  blood in stool, No  diarrhea, No  constipation   Musculoskeletal: No new myalgia/arthralgia  Genitourinary: No  incontinence, No  abnormal genital bleeding, No abnormal genital discharge  Skin: No  Rash  Neurologic: No  weakness, No  dizziness  Exam:  BP 135/85   Pulse 68   Ht 5\' 2"  (1.575 m)   Wt 188 lb (85.3 kg)   LMP 10/27/2015   BMI 34.39 kg/m   Constitutional: VS see above. General Appearance: alert, well-developed, well-nourished, NAD  Ears, Nose, Mouth, Throat: MMM  Neck: No masses, trachea midline. No thyroid enlargement.   Respiratory: Normal respiratory effort. no wheeze, no rhonchi, no rales  Cardiovascular: S1/S2 normal, no murmur, no rub/gallop auscultated. RRR. No lower extremity edema.   Gastrointestinal: (+)TTP low RLQ, no masses. No hepatomegaly, no splenomegaly. No hernia appreciated. Bowel sounds normal. Rectal exam deferred.   Musculoskeletal: Gait normal. No clubbing/cyanosis of digits.   Neurological: Normal balance/coordination. No tremor.    Skin: warm, dry, intact.    Psychiatric: Normal judgment/insight. Normal mood and affect. Oriented x3.    Results for orders placed or performed in visit on 11/27/15 (from the past 72 hour(s))  POCT Urinalysis Dipstick     Status: Normal   Collection Time: 11/27/15  8:53 AM  Result Value Ref Range   Color, UA YELLOW    Clarity, UA CLEAR    Glucose, UA NEGATIVE    Bilirubin, UA NEGATIVE    Ketones, UA NEGATIVE    Spec Grav, UA 1.010    Blood, UA NEGATIVE    pH, UA 6.0    Protein, UA NEGATIVE    Urobilinogen, UA 0.2    Nitrite, UA NEGATIVE    Leukocytes, UA Negative Negative  POCT urine pregnancy     Status: None   Collection Time: 11/27/15  9:25 AM  Result Value Ref Range   Preg Test, Ur Negative Negative      ASSESSMENT/PLAN:   Right lower  quadrant abdominal pain - Plan: POCT Urinalysis Dipstick, US Pelvis Complete, US Transvaginal Non-OB, CANCELED: US Transvaginal Non-OB  Pelvic pain in female - Plan: US Pelvis Complete, US Transvaginal Non-OB, CANCELED: US Transvaginal Non-OB    Patient Instructions  Plan: We are getting an ultrasound today to evaluate for ovarian cyst which is why suspect is probably the cause of your pain. If the pain persists or gets worse and/or the ultrasound is normal, we will need to consider further imaging with CT of the abdomen and pelvis to look for other causes such as appendix problems, or kidney stones, or intestinal problem, or other cause. In the meantime, can take ibuprofen 400-800 mg every 6 hours as needed for pain, try heating pad. If fever or nausea or severe abdominal pain developed, please seek care ASAP.   Additional instructions printed on Pelvic Pain, Female     Visit summary with medication list and pertinent instructions was printed for patient to review. All questions at time of visit were answered - patient instructed to contact office with any additional concerns. ER/RTC precautions were reviewed with the patient. Follow-up plan: Return if symptoms worsen or fail to improve.

## 2015-11-27 NOTE — Patient Instructions (Signed)
Plan: We are getting an ultrasound today to evaluate for ovarian cyst which is why suspect is probably the cause of your pain. If the pain persists or gets worse and/or the ultrasound is normal, we will need to consider further imaging with CT of the abdomen and pelvis to look for other causes such as appendix problems, or kidney stones, or intestinal problem, or other cause. In the meantime, can take ibuprofen 400-800 mg every 6 hours as needed for pain, try heating pad. If fever or nausea or severe abdominal pain developed, please seek care ASAP.   Pelvic Pain, Female Female pelvic pain can be caused by many different things and start from a variety of places. Pelvic pain refers to pain that is located in the lower half of the abdomen and between your hips. The pain may occur over a short period of time (acute) or may be reoccurring (chronic). The cause of pelvic pain may be related to disorders affecting the female reproductive organs (gynecologic), but it may also be related to the bladder, kidney stones, an intestinal complication, or muscle or skeletal problems. Getting help right away for pelvic pain is important, especially if there has been severe, sharp, or a sudden onset of unusual pain. It is also important to get help right away because some types of pelvic pain can be life threatening.  CAUSES  Below are only some of the causes of pelvic pain. The causes of pelvic pain can be in one of several categories.   Gynecologic.  Pelvic inflammatory disease.  Sexually transmitted infection.  Ovarian cyst or a twisted ovarian ligament (ovarian torsion).  Uterine lining that grows outside the uterus (endometriosis).  Fibroids, cysts, or tumors.  Ovulation.  Pregnancy.  Pregnancy that occurs outside the uterus (ectopic pregnancy).  Miscarriage.  Labor.  Abruption of the placenta or ruptured uterus.  Infection.  Uterine infection (endometritis).  Bladder  infection.  Diverticulitis.  Miscarriage related to a uterine infection (septic abortion).  Bladder.  Inflammation of the bladder (cystitis).  Kidney stone(s).  Gastrointestinal.  Constipation.  Diverticulitis.  Neurologic.  Trauma.  Feeling pelvic pain because of mental or emotional causes (psychosomatic).  Cancers of the bowel or pelvis. EVALUATION  Your caregiver will want to take a careful history of your concerns. This includes recent changes in your health, a careful gynecologic history of your periods (menses), and a sexual history. Obtaining your family history and medical history is also important. Your caregiver may suggest a pelvic exam. A pelvic exam will help identify the location and severity of the pain. It also helps in the evaluation of which organ system may be involved. In order to identify the cause of the pelvic pain and be properly treated, your caregiver may order tests. These tests may include:   A pregnancy test.  Pelvic ultrasonography.  An X-ray exam of the abdomen.  A urinalysis or evaluation of vaginal discharge.  Blood tests. HOME CARE INSTRUCTIONS   Only take over-the-counter or prescription medicines for pain, discomfort, or fever as directed by your caregiver.   Rest as directed by your caregiver.   Eat a balanced diet.   Drink enough fluids to make your urine clear or pale yellow, or as directed.   Avoid sexual intercourse if it causes pain.   Apply warm or cold compresses to the lower abdomen depending on which one helps the pain.   Avoid stressful situations.   Keep a journal of your pelvic pain. Write down when it started, where  the pain is located, and if there are things that seem to be associated with the pain, such as food or your menstrual cycle.  Follow up with your caregiver as directed.  SEEK MEDICAL CARE IF:  Your medicine does not help your pain.  You have abnormal vaginal discharge. SEEK IMMEDIATE  MEDICAL CARE IF:   You have heavy bleeding from the vagina.   Your pelvic pain increases.   You feel light-headed or faint.   You have chills.   You have pain with urination or blood in your urine.   You have uncontrolled diarrhea or vomiting.   You have a fever or persistent symptoms for more than 3 days.  You have a fever and your symptoms suddenly get worse.   You are being physically or sexually abused.   This information is not intended to replace advice given to you by your health care provider. Make sure you discuss any questions you have with your health care provider.   Document Released: 12/09/2003 Document Revised: 10/02/2014 Document Reviewed: 05/03/2011 Elsevier Interactive Patient Education [employer].

## 2015-12-02 DIAGNOSIS — N926 Irregular menstruation, unspecified: Secondary | ICD-10-CM | POA: Diagnosis not present

## 2015-12-02 DIAGNOSIS — N904 Leukoplakia of vulva: Secondary | ICD-10-CM | POA: Diagnosis not present

## 2015-12-02 DIAGNOSIS — N909 Noninflammatory disorder of vulva and perineum, unspecified: Secondary | ICD-10-CM | POA: Diagnosis not present

## 2015-12-02 DIAGNOSIS — N925 Other specified irregular menstruation: Secondary | ICD-10-CM | POA: Diagnosis not present

## 2015-12-04 ENCOUNTER — Ambulatory Visit: Payer: BLUE CROSS/BLUE SHIELD | Admitting: Obstetrics & Gynecology

## 2015-12-15 ENCOUNTER — Ambulatory Visit: Payer: BLUE CROSS/BLUE SHIELD | Admitting: Obstetrics & Gynecology

## 2016-01-13 DIAGNOSIS — N83201 Unspecified ovarian cyst, right side: Secondary | ICD-10-CM | POA: Diagnosis not present

## 2016-01-13 DIAGNOSIS — E282 Polycystic ovarian syndrome: Secondary | ICD-10-CM | POA: Diagnosis not present

## 2016-01-13 DIAGNOSIS — N909 Noninflammatory disorder of vulva and perineum, unspecified: Secondary | ICD-10-CM | POA: Diagnosis not present

## 2016-04-20 DIAGNOSIS — Z6825 Body mass index (BMI) 25.0-25.9, adult: Secondary | ICD-10-CM | POA: Diagnosis not present

## 2016-04-20 DIAGNOSIS — Z01419 Encounter for gynecological examination (general) (routine) without abnormal findings: Secondary | ICD-10-CM | POA: Diagnosis not present

## 2016-04-26 DIAGNOSIS — N970 Female infertility associated with anovulation: Secondary | ICD-10-CM | POA: Diagnosis not present

## 2016-06-09 ENCOUNTER — Ambulatory Visit (INDEPENDENT_AMBULATORY_CARE_PROVIDER_SITE_OTHER): Payer: BLUE CROSS/BLUE SHIELD | Admitting: Physician Assistant

## 2016-06-09 VITALS — BP 119/83 | HR 81 | Temp 97.5°F | Wt 183.0 lb

## 2016-06-09 DIAGNOSIS — J029 Acute pharyngitis, unspecified: Secondary | ICD-10-CM | POA: Diagnosis not present

## 2016-06-09 LAB — POCT RAPID STREP A (OFFICE): Rapid Strep A Screen: NEGATIVE

## 2016-06-09 MED ORDER — PENICILLIN G BENZATHINE & PROC 1200000 UNIT/2ML IM SUSP
1.2000 10*6.[IU] | Freq: Once | INTRAMUSCULAR | Status: AC
  Administered 2016-06-09: 1.2 10*6.[IU] via INTRAMUSCULAR

## 2016-06-09 MED ORDER — IPRATROPIUM BROMIDE 0.06 % NA SOLN: 1.0000 | Freq: Four times a day (QID) | NASAL | 0 refills | Status: DC | PRN

## 2016-06-09 NOTE — Progress Notes (Signed)
HPI:                                                                Andreya Lacks is a 31 y.o. female who presents to [facility] today for URI symptoms  URI   This is a new problem. The current episode started in the past 7 days (x 2 days). There has been no fever. Associated symptoms include congestion (right-sided), sinus pain and a sore throat. Pertinent negatives include no coughing, headaches or wheezing. Associated symptoms comments: + chills. Treatments tried: Mucinex, Nyquil.     Past Medical History:  Diagnosis Date  . History of HPV infection   . Tobacco use 05/03/2012   No past surgical history on file. Social History  Substance Use Topics  . Smoking status: Current Every Day Smoker    Packs/day: 0.50    Years: 5.00    Types: Cigarettes  . Smokeless tobacco: Never Used  . Alcohol use Yes   family history includes Thyroid disease in her mother.  ROS: negative except as noted in the HPI  Medications: Current Outpatient Prescriptions  Medication Sig Dispense Refill  . ampicillin (PRINCIPEN) 250 MG capsule Take 250 mg by mouth 4 (four) times daily as needed.    . clindamycin (CLEOCIN) 300 MG capsule Take 300 mg by mouth 3 (three) times daily as needed.    . metFORMIN (GLUCOPHAGE-XR) 750 MG 24 hr tablet Take 750 mg by mouth daily with breakfast.    . Omega-3 Fatty Acids (EQL FISH OIL) 1000 MG CAPS 1 by mouth in the evening. 90 each   . amoxicillin-clavulanate (AUGMENTIN) 875-125 MG tablet Take 1 tablet by mouth 2 (two) times daily. One po bid x 7 days 14 tablet 0  . ipratropium (ATROVENT) 0.06 % nasal spray Place 1 spray into both nostrils 4 (four) times daily as needed. 15 mL 0  . traMADol (ULTRAM) 50 MG tablet Take 1 tablet (50 mg total) by mouth every 6 (six) hours as needed. 12 tablet 0  . trimethoprim-polymyxin b (POLYTRIM) ophthalmic solution Place 1 drop into the right eye every 6 (six) hours. 10 mL 0   No current  facility-administered medications for this visit.    No Known Allergies     Objective:  BP 119/83   Pulse 81   Temp 97.5 F (36.4 C) (Oral)   Wt 183 lb (83 kg)   LMP 05/13/2016   BMI 33.47 kg/m  Gen: well-groomed, cooperative, not ill-appearing, no distress HEENT: normal conjunctiva, TM's clear, nasal mucosa edematous, oropharynx erythematous, tonsillar exudates, uvula midline, moist mucus membranes, no frontal or maxillary sinus tenderness, neck supple, trachea midline Pulm: Normal work of breathing, normal phonation, clear to auscultation bilaterally, no wheezes, rales or rhonchi CV: Normal rate, regular rhythm, s1 and s2 distinct, no murmurs, clicks or rubs  GI: abdomen soft, nondistended, nontender Neuro: alert and oriented x 3, EOM's intact, no tremor MSK: moving all extremities, normal gait and station, no peripheral edema Lymph: tender tonsillar adenopathy present Skin: warm, dry, intact; no rashes or lesions on exposed skin, no cyanosis  Assessment and Plan: 31 y.o. female with   1. Acute pharyngitis, unspecified etiology - POCT rapid strep A negative - Centor score 3, patient given  1 dose of Bicillin in clinic. Still possible viral etiology. Discussed symptomatic management - ipratropium (ATROVENT) 0.06 % nasal spray; Place 1 spray into both nostrils 4 (four) times daily as needed.  Dispense: 15 mL; Refill: 0 - penicillin g procaine-penicillin g benzathine (BICILLIN-CR) injection 600000-600000 units; Inject 2 mLs (1.2 Million Units total) into the muscle once.  Patient education and anticipatory guidance given Patient agrees with treatment plan Follow-up as needed if symptoms worsen or fail to improve  Darlyne Russian PA-C

## 2016-06-09 NOTE — Patient Instructions (Addendum)
-   Atrovent nasal spray up to 4 times daily as needed for nasal congestion - Sudafed 60mg  every 4-6 hours (may cause sleep disturbance, so recommend you do not take medication after 2pm) - Ibuprofen 600mg  every 8 hours as needed for sore throat - Cepacol throat lozenges, Warm tea and honey, Salt water gargles   Pharyngitis Pharyngitis is redness, pain, and swelling (inflammation) of your pharynx. What are the causes? Pharyngitis is usually caused by infection. Most of the time, these infections are from viruses (viral) and are part of a cold. However, sometimes pharyngitis is caused by bacteria (bacterial). Pharyngitis can also be caused by allergies. Viral pharyngitis may be spread from person to person by coughing, sneezing, and personal items or utensils (cups, forks, spoons, toothbrushes). Bacterial pharyngitis may be spread from person to person by more intimate contact, such as kissing. What are the signs or symptoms? Symptoms of pharyngitis include:  Sore throat.  Tiredness (fatigue).  Low-grade fever.  Headache.  Joint pain and muscle aches.  Skin rashes.  Swollen lymph nodes.  Plaque-like film on throat or tonsils (often seen with bacterial pharyngitis). How is this diagnosed? Your health care provider will ask you questions about your illness and your symptoms. Your medical history, along with a physical exam, is often all that is needed to diagnose pharyngitis. Sometimes, a rapid strep test is done. Other lab tests may also be done, depending on the suspected cause. How is this treated? Viral pharyngitis will usually get better in 3-4 days without the use of medicine. Bacterial pharyngitis is treated with medicines that kill germs (antibiotics). Follow these instructions at home:  Drink enough water and fluids to keep your urine clear or pale yellow.  Only take over-the-counter or prescription medicines as directed by your health care provider:  If you are prescribed  antibiotics, make sure you finish them even if you start to feel better.  Do not take aspirin.  Get lots of rest.  Gargle with 8 oz of salt water ( tsp of salt per 1 qt of water) as often as every 1-2 hours to soothe your throat.  Throat lozenges (if you are not at risk for choking) or sprays may be used to soothe your throat. Contact a health care provider if:  You have large, tender lumps in your neck.  You have a rash.  You cough up green, yellow-Fredrick, or bloody spit. Get help right away if:  Your neck becomes stiff.  You drool or are unable to swallow liquids.  You vomit or are unable to keep medicines or liquids down.  You have severe pain that does not go away with the use of recommended medicines.  You have trouble breathing (not caused by a stuffy nose). This information is not intended to replace advice given to you by your health care provider. Make sure you discuss any questions you have with your health care provider. Document Released: 01/11/2005 Document Revised: 06/19/2015 Document Reviewed: 09/18/2012 Elsevier Interactive Patient Education  2017 [employer].

## 2016-06-10 ENCOUNTER — Encounter: Payer: Self-pay | Admitting: Physician Assistant

## 2016-06-11 ENCOUNTER — Telehealth: Payer: Self-pay | Admitting: Physician Assistant

## 2016-06-11 ENCOUNTER — Ambulatory Visit (INDEPENDENT_AMBULATORY_CARE_PROVIDER_SITE_OTHER): Payer: BLUE CROSS/BLUE SHIELD | Admitting: Physician Assistant

## 2016-06-11 VITALS — BP 133/90 | HR 56 | Temp 97.8°F | Wt 185.0 lb

## 2016-06-11 DIAGNOSIS — B309 Viral conjunctivitis, unspecified: Secondary | ICD-10-CM

## 2016-06-11 DIAGNOSIS — H1031 Unspecified acute conjunctivitis, right eye: Secondary | ICD-10-CM

## 2016-06-11 DIAGNOSIS — J069 Acute upper respiratory infection, unspecified: Secondary | ICD-10-CM

## 2016-06-11 IMAGING — CR DG ABDOMEN 1V
1 series · 1 of 1 positions shown · non-contrast
Comparison: None.

CLINICAL DATA: Abdominal pain.  Nausea.

EXAM:
ABDOMEN - 1 VIEW

[abdomen supine]
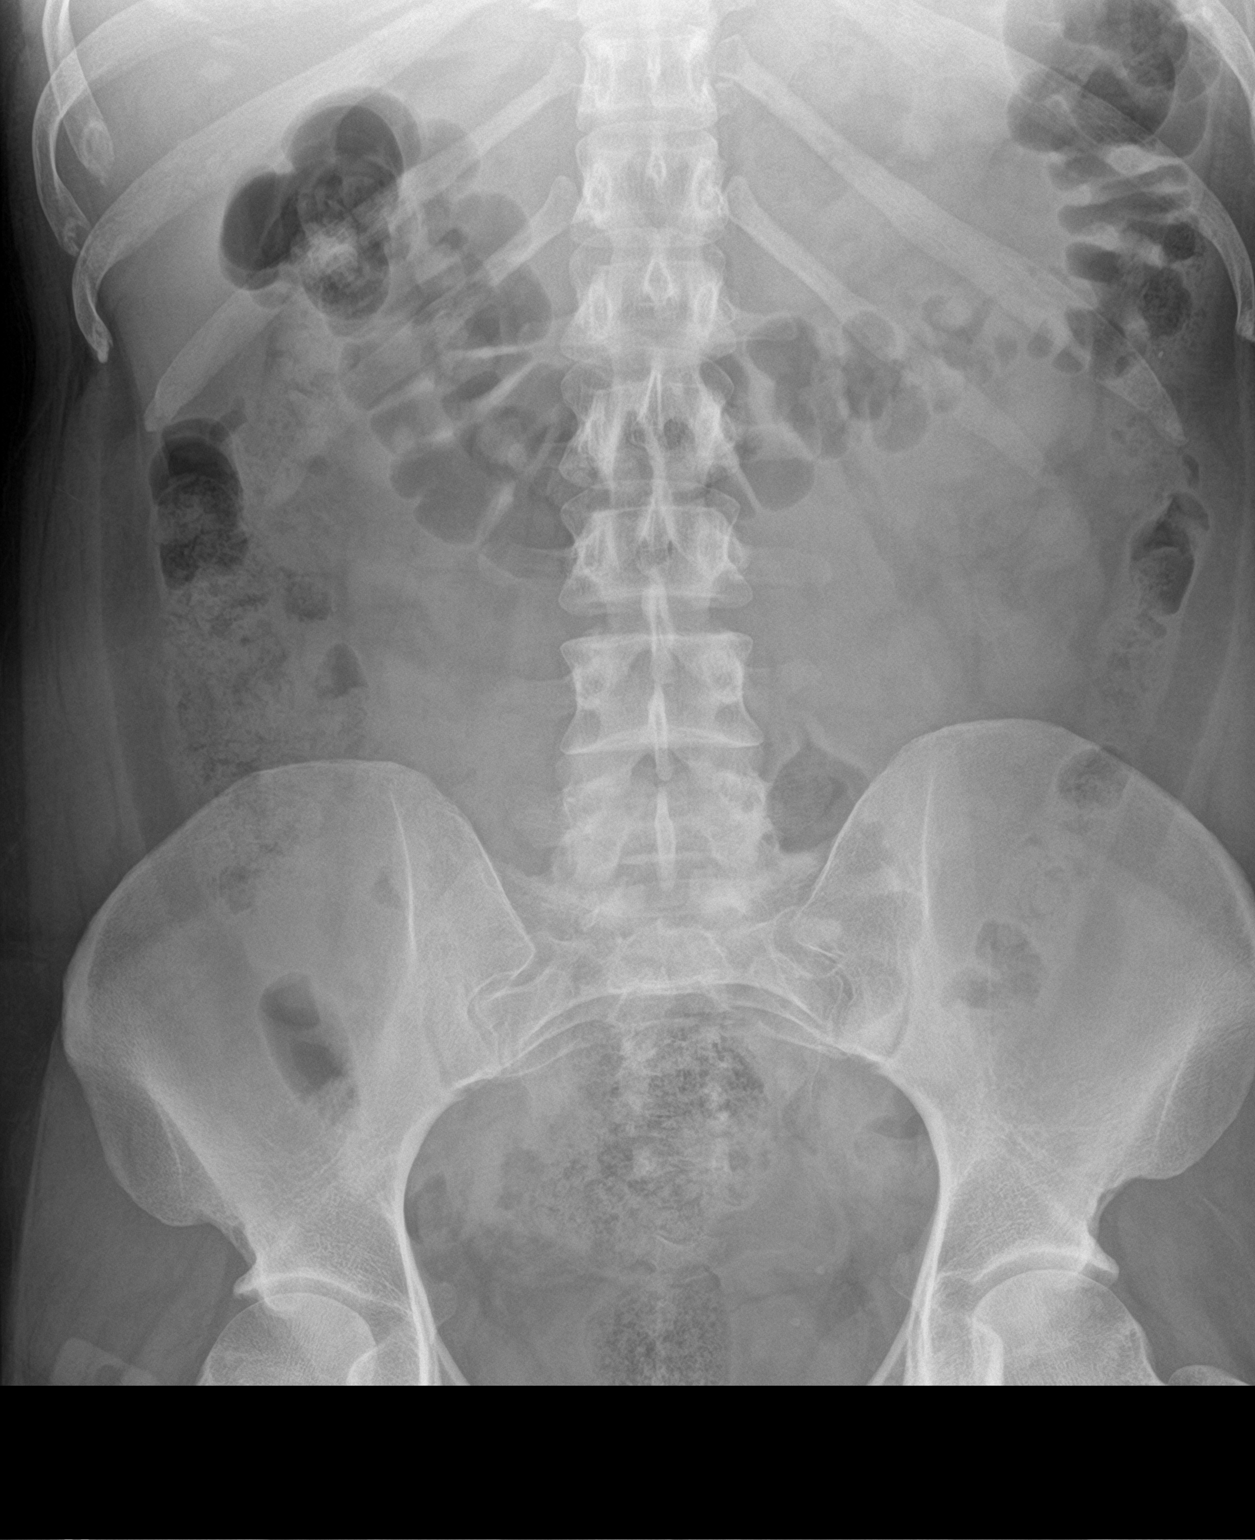

[1 of 1 positions shown; findings below may reference images not displayed]

FINDINGS: Soft tissue structures are unremarkable. No bowel distention. No
free air. Stool noted throughout the colon. Calcified pelvic
densities are noted most consistent with phleboliths.
IMPRESSION: No acute abnormality.

## 2016-06-11 MED ORDER — POLYMYXIN B-TRIMETHOPRIM 10000-0.1 UNIT/ML-% OP SOLN: 1.0000 [drp] | Freq: Four times a day (QID) | OPHTHALMIC | 0 refills | Status: AC

## 2016-06-11 NOTE — Telephone Encounter (Signed)
Patient sent a MyChart message the day after an acute visit for a pharyngitis that she had developed right eye mucus and swelling and would like an antibiotic eye drop. Patient does not wear contact lenses. Denies eye pain or foreign body sensation. Endorses some mild blurred vision. Patient likely has a viral conjunctivitis given preceding URI symptoms.  Prescription sent for Polytrim drops QID to the right eye. Educated patient that infection is likely viral and contagious. Instructed to return if symptoms worsen or fail to improve.

## 2016-06-11 NOTE — Progress Notes (Signed)
HPI:                                                                Cynthia Christensen is a 31 y.o. female who presents to [facility] today for "pink eye" and ear pain  Patient reports right eye redness and discharge that developed yesterday. She also reports right ear pain that developed today. Patient was seen 2 days ago in our practice for acute pharyngitis. Rapid Strep was negative, but patient had tonsillar exudates and Centor score of 3 so she was treated empirically with IM Penicillin. She continues to endorse a sore throat. She denies fever, chills, vision change, eye pain, cough, or shortness of breath.   Past Medical History:  Diagnosis Date  . History of HPV infection   . Tobacco use 05/03/2012   No past surgical history on file. Social History  Substance Use Topics  . Smoking status: Current Every Day Smoker    Packs/day: 0.50    Years: 5.00    Types: Cigarettes  . Smokeless tobacco: Not on file  . Alcohol use Yes   family history includes Thyroid disease in her mother.  ROS: negative except as noted in the HPI  Medications: Current Outpatient Prescriptions  Medication Sig Dispense Refill  . ampicillin (PRINCIPEN) 250 MG capsule Take 250 mg by mouth 4 (four) times daily as needed.    . clindamycin (CLEOCIN) 300 MG capsule Take 300 mg by mouth 3 (three) times daily as needed.    [name] ipratropium (ATROVENT) 0.06 % nasal spray Place 1 spray into both nostrils 4 (four) times daily as needed. 15 mL 0  . metFORMIN (GLUCOPHAGE-XR) 750 MG 24 hr tablet Take 750 mg by mouth daily with breakfast.    . Omega-3 Fatty Acids (EQL FISH OIL) 1000 MG CAPS 1 by mouth in the evening. 90 each   . trimethoprim-polymyxin b (POLYTRIM) ophthalmic solution Place 1 drop into the right eye every 6 (six) hours. 10 mL 0   No current facility-administered medications for this visit.    No Known Allergies     Objective:  BP 133/90   Pulse (!) 56    Temp 97.8 F (36.6 C) (Oral)   Wt 185 lb (83.9 kg)   BMI 33.84 kg/m  Gen: well-groomed, cooperative, not ill-appearing, no distress HEENT: right sclera injected, right lid normal without debris or discharge, right TM non-bulging with mild erythema, left TM clear, oropharynx with erythema, tonsillar exudates, uvula midline, moist mucus membranes, no frontal or maxillary sinus tenderness, neck supple, trachea midline Pulm: Normal work of breathing, normal phonation, clear to auscultation bilaterally, no wheezes, rales or rhonchi CV: Normal rate, regular rhythm, s1 and s2 distinct, no murmurs, clicks or rubs  Neuro: alert and oriented x 3, EOM's intact, no tremor MSK: moving all extremities, normal gait and station, no peripheral edema Lymph: no cervical or tonsillar adenopathy Skin: warm, dry, intact; no rashes or lesions on exposed skin, no cyanosis   Results for orders placed or performed in visit on 06/09/16 (from the past 72 hour(s))  POCT rapid strep A     Status: Normal   Collection Time: 06/09/16 11:40 AM  Result Value Ref Range   Rapid Strep A Screen Negative Negative  No results found.    Assessment and Plan: 31 y.o. female with   1. Upper respiratory infection of multiple sites - continue symptomatic management as discussed - Ibuprofen 600mg  every 8 hours as needed for ear/throat pain  2. Acute viral conjunctivitis of right eye - Polytrim eye drops sent, though I suspect this is a viral syndrome given lack of response to Penicillin and multiple sites of infection - recommend OTC antihistamine eye drops as well  Patient education and anticipatory guidance given Patient agrees with treatment plan Follow-up as needed if symptoms worsen or fail to improve  Darlyne Russian PA-C

## 2016-06-11 NOTE — Patient Instructions (Signed)
Viral Conjunctivitis, Adult Viral conjunctivitis is an inflammation of the clear membrane that covers the white part of your eye and the inner surface of your eyelid (conjunctiva). The inflammation is caused by a viral infection. The blood vessels in the conjunctiva become inflamed, causing the eye to become red or pink, and often itchy. Viral conjunctivitis can be easily passed from one person to another (is contagious). This condition is often called pink eye. What are the causes? This condition is caused by a virus. A virus is a type of contagious germ. It can be spread by touching objects that have been contaminated with the virus, such as doorknobs or towels. It can also be passed through droplets, such as from coughing or sneezing. What are the signs or symptoms? Symptoms of this condition include:  Eye redness.  Tearing or watery eyes.  Itchy and irritated eyes.  Burning feeling in the eyes.  Clear drainage from the eye.  Swollen eyelids.  A gritty feeling in the eye.  Light sensitivity. This condition often occurs with other symptoms, such as a fever, nausea, or a rash. How is this diagnosed? This condition is diagnosed with a medical history and physical exam. If you have discharge from your eye, the discharge may be tested to rule out other causes of conjunctivitis. How is this treated? Viral conjunctivitis does not respond to medicines that kill bacteria (antibiotics). Treatment for viral conjunctivitis is directed at stopping a bacterial infection from developing in addition to the viral infection. Treatment also aims to relieve your symptoms, such as itching. This may be done with antihistamine drops or other eye medicines. Rarely, steroid eye drops or antiviral medicines may be prescribed. Follow these instructions at home: Medicines    Take or apply over-the-counter and prescription medicines only as told by your health care provider.  Be very careful to avoid touching  the edge of the eyelid with the eye drop bottle or ointment tube when applying medicines to the affected eye. Being careful this way will stop you from spreading the infection to the other eye or to other people. Eye care   Avoid touching or rubbing your eyes.  Apply a warm, wet, clean washcloth to your eye for 10-20 minutes, 3-4 times per day or as told by your health care provider.  If you wear contact lenses, do not wear them until the inflammation is gone and your health care provider says it is safe to wear them again. Ask your health care provider how to sterilize or replace your contact lenses before using them again. Wear glasses until you can resume wearing contacts.  Avoid wearing eye makeup until the inflammation is gone. Throw away any old eye cosmetics that may be contaminated.  Gently wipe away any drainage from your eye with a warm, wet washcloth or a cotton ball. General instructions   Change or wash your pillowcase every day or as told by your health care provider.  Do not share towels, pillowcases, washcloths, eye makeup, makeup brushes, contact lenses, or glasses. This may spread the infection.  Wash your hands often with soap and water. Use paper towels to dry your hands. If soap and water are not available, use hand sanitizer.  Try to avoid contact with other people for one week or as told by your health care provider. Contact a health care provider if:  Your symptoms do not improve with treatment or they get worse.  You have increased pain.  Your vision becomes blurry.  You   have a fever.  You have facial pain, redness, or swelling.  You have yellow or green drainage coming from your eye.  You have new symptoms. This information is not intended to replace advice given to you by your health care provider. Make sure you discuss any questions you have with your health care provider. Document Released: 04/03/2002 Document Revised: 08/09/2015 Document Reviewed:  07/29/2015 Elsevier Interactive Patient Education  2017 [employer] Inc[employer]  

## 2016-06-12 ENCOUNTER — Encounter: Payer: Self-pay | Admitting: Emergency Medicine

## 2016-06-12 ENCOUNTER — Emergency Department
Admission: EM | Admit: 2016-06-12 | Discharge: 2016-06-12 | Disposition: A | Discharge status: Home / Self Care | Payer: BLUE CROSS/BLUE SHIELD | Source: Home / Self Care | Attending: Family Medicine | Admitting: Family Medicine

## 2016-06-12 DIAGNOSIS — H6693 Otitis media, unspecified, bilateral: Secondary | ICD-10-CM | POA: Diagnosis not present

## 2016-06-12 MED ORDER — AMOXICILLIN-POT CLAVULANATE 875-125 MG PO TABS: 1.0000 | ORAL_TABLET | Freq: Two times a day (BID) | ORAL | 0 refills | Status: DC

## 2016-06-12 MED ORDER — TRAMADOL HCL 50 MG PO TABS: 50.0000 mg | ORAL_TABLET | Freq: Four times a day (QID) | ORAL | 0 refills | Status: DC | PRN

## 2016-06-12 NOTE — Discharge Instructions (Signed)
°  Tramadol is strong pain medication. While taking, do not drink alcohol, drive, or perform any other activities that requires focus while taking these medications.  ° °

## 2016-06-12 NOTE — ED Triage Notes (Signed)
Pt c/o left ear pain and draining fluid and blood. Started last night.

## 2016-06-12 NOTE — ED Provider Notes (Signed)
CSN: [id number]     Arrival date & time 06/12/16  6294 History   First MD Initiated Contact with Patient 06/12/16 1049     Chief Complaint  Patient presents with  . Otalgia   (Consider location/radiation/quality/duration/timing/severity/associated sxs/prior Treatment) HPI  Cynthia Christensen is a 31 y.o. female presenting to UC with c/o severe Left ear pain with bloody fluid draining from ear last night.  She has had sinus congestion for about 1 week.  She was seen by her PCP earlier in the week and was advised symptoms were likely due to a virus.  She f/u again this week due to Right ear pain. She was given a shot of Penicillin but states ear pain has continued to worsen. She is requesting ear drops for the pain. Denies fever, chills, n/v/d. Mild cough.    Past Medical History:  Diagnosis Date  . History of HPV infection   . Tobacco use 05/03/2012   History reviewed. No pertinent surgical history. Family History  Problem Relation Age of Onset  . Lung cancer Unknown        grandmother  . Heart attack Unknown        grandfather  . Thyroid disease Mother    Social History  Substance Use Topics  . Smoking status: Current Every Day Smoker    Packs/day: 0.50    Years: 5.00    Types: Cigarettes  . Smokeless tobacco: Never Used  . Alcohol use Yes   OB History    Gravida Para Term Preterm AB Living   1       1     SAB TAB Ectopic Multiple Live Births     1           Review of Systems  Constitutional: Negative for chills and fever.  HENT: Positive for congestion, ear discharge, ear pain, postnasal drip and sinus pressure. Negative for sore throat, trouble swallowing and voice change.   Respiratory: Positive for cough. Negative for shortness of breath.   Cardiovascular: Negative for chest pain and palpitations.  Gastrointestinal: Negative for abdominal pain, diarrhea, nausea and vomiting.  Musculoskeletal: Negative for arthralgias, back pain and myalgias.  Skin: Negative for rash.   Neurological: Positive for headaches. Negative for dizziness and light-headedness.    Allergies  Patient has no known allergies.  Home Medications   Prior to Admission medications   Medication Sig Start Date End Date Taking? Authorizing Provider  amoxicillin-clavulanate (AUGMENTIN) 875-125 MG tablet Take 1 tablet by mouth 2 (two) times daily. One po bid x 7 days 06/12/16   Noland Fordyce, PA-C  ampicillin (PRINCIPEN) 250 MG capsule Take 250 mg by mouth 4 (four) times daily as needed.    [provider]  clindamycin (CLEOCIN) 300 MG capsule Take 300 mg by mouth 3 (three) times daily as needed.    [provider]  ipratropium (ATROVENT) 0.06 % nasal spray Place 1 spray into both nostrils 4 (four) times daily as needed. 06/09/16   Trixie Dredge, PA-C  metFORMIN (GLUCOPHAGE-XR) 750 MG 24 hr tablet Take 750 mg by mouth daily with breakfast.    [provider]  Omega-3 Fatty Acids (EQL FISH OIL) 1000 MG CAPS 1 by mouth in the evening. 07/22/15   Hommel, Sean, DO  traMADol (ULTRAM) 50 MG tablet Take 1 tablet (50 mg total) by mouth every 6 (six) hours as needed. 06/12/16   Noland Fordyce, PA-C  trimethoprim-polymyxin b (POLYTRIM) ophthalmic solution Place 1 drop into the right eye every  6 (six) hours. 06/11/16 06/16/16  Trixie Dredge, PA-C   Meds Ordered and Administered this Visit  Medications - No data to display  BP 116/81 (BP Location: Right Arm)   Pulse 74   Temp 97.5 F (36.4 C) (Oral)   Wt 184 lb (83.5 kg)   LMP 05/13/2016   SpO2 97%   BMI 33.65 kg/m  No data found.   Physical Exam  Constitutional: She is oriented to person, place, and time. She appears well-developed and well-nourished. No distress.  HENT:  Head: Normocephalic and atraumatic.  Right Ear: Tympanic membrane is erythematous. Tympanic membrane is not bulging.  Left Ear: There is drainage ( bloody). Tympanic membrane is erythematous and bulging. Left ear perforated  TM:  unable to visualize entire TM, questional pinpoint perforation.  Nose: Mucosal edema present.  Mouth/Throat: Uvula is midline, oropharynx is clear and moist and mucous membranes are normal.  Eyes: EOM are normal.  Neck: Normal range of motion. Neck supple.  Cardiovascular: Normal rate and regular rhythm.   Pulmonary/Chest: Effort normal and breath sounds normal. No stridor. No respiratory distress. She has no wheezes. She has no rales.  Musculoskeletal: Normal range of motion.  Lymphadenopathy:    She has no cervical adenopathy.  Neurological: She is alert and oriented to person, place, and time.  Skin: Skin is warm and dry. She is not diaphoretic.  Psychiatric: She has a normal mood and affect. Her behavior is normal.  Nursing note and vitals reviewed.   Urgent Care Course     Procedures (including critical care time)  Labs Review Labs Reviewed - No data to display  Imaging Review No results found.   MDM   1. Bilateral acute otitis media    Exam c/w bilateral AOM Despite having the shot of PCN earlier in the week, pt continued to worsen.  Will prescribed 1 week of Augmentin  Rx: Augmentin and Tramadol      Noland Fordyce, PA-C 06/12/16 1208

## 2016-06-15 ENCOUNTER — Encounter: Payer: Self-pay | Admitting: Physician Assistant

## 2016-06-16 ENCOUNTER — Ambulatory Visit: Payer: BLUE CROSS/BLUE SHIELD | Admitting: Physician Assistant

## 2016-06-16 DIAGNOSIS — H9313 Tinnitus, bilateral: Secondary | ICD-10-CM | POA: Diagnosis not present

## 2016-06-16 DIAGNOSIS — H6593 Unspecified nonsuppurative otitis media, bilateral: Secondary | ICD-10-CM | POA: Diagnosis not present

## 2016-06-16 DIAGNOSIS — H748X3 Other specified disorders of middle ear and mastoid, bilateral: Secondary | ICD-10-CM | POA: Diagnosis not present

## 2016-06-30 DIAGNOSIS — Z8669 Personal history of other diseases of the nervous system and sense organs: Secondary | ICD-10-CM | POA: Diagnosis not present

## 2017-02-04 ENCOUNTER — Ambulatory Visit: Payer: BLUE CROSS/BLUE SHIELD | Admitting: Physician Assistant

## 2017-02-04 ENCOUNTER — Encounter: Payer: Self-pay | Admitting: Physician Assistant

## 2017-02-04 VITALS — BP 129/81 | HR 68 | Temp 97.8°F | Wt 184.0 lb

## 2017-02-04 DIAGNOSIS — J069 Acute upper respiratory infection, unspecified: Secondary | ICD-10-CM | POA: Diagnosis not present

## 2017-02-04 DIAGNOSIS — H6983 Other specified disorders of Eustachian tube, bilateral: Secondary | ICD-10-CM

## 2017-02-04 MED ORDER — METHYLPREDNISOLONE 4 MG PO TBPK: ORAL_TABLET | ORAL | 0 refills | Status: DC

## 2017-02-04 MED ORDER — AMOXICILLIN-POT CLAVULANATE 875-125 MG PO TABS: 1.0000 | ORAL_TABLET | Freq: Two times a day (BID) | ORAL | 0 refills | Status: DC

## 2017-02-04 NOTE — Progress Notes (Addendum)
   Subjective:    Patient ID: Cynthia Christensen, female    DOB: [birth year], 32 y.o.   MRN: [id number]  HPI  Cynthia Christensen is a pleasanmt 32 year old female that began having symptoms two days ago. Symptoms include productive cough with white mucous, post nasal drip, nasal congestion, right sided frontal sinus pressure, sore throat, and ear popping sensation.  She has tried mucinex and Dayquil with minimal relief. She has a past history of persistent bilateral middle ear effusion and is concerned that this may provoke a similar event.   .. Active Ambulatory Problems    Diagnosis Date Noted  . Candidal skin infection 05/03/2012  . Tinea versicolor 05/03/2012  . Tobacco use 05/03/2012  . Hirsutism 05/22/2012  . Hyperlipidemia 05/22/2012  . Personal history of other specified conditions 08/07/2012  . Acne inversa 08/07/2012  . 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 Lip swelling 09/19/2015  . Tongue swelling 09/19/2015  . Allergic reaction to food 09/19/2015   Resolved Ambulatory Problems    Diagnosis Date Noted  . No Resolved Ambulatory Problems   Past Medical History:  Diagnosis Date  . History of HPV infection   . Tobacco use 05/03/2012       Review of Systems  Constitutional: Positive for fatigue. Negative for chills and fever.  HENT: Positive for congestion, ear pain, postnasal drip, sinus pressure, sinus pain and sore throat.   Respiratory: Positive for cough.        Objective:   Physical Exam  Constitutional: She is oriented to person, place, and time. She appears well-developed and well-nourished. No distress.  HENT:  Head: Normocephalic and atraumatic.  Right Ear: External ear normal. No swelling. No middle ear effusion.  Left Ear: No swelling.  No middle ear effusion.  Nose: Mucosal edema present. No rhinorrhea.  Mouth/Throat: Posterior oropharyngeal erythema present.  Eyes: Right eye exhibits no discharge. Left eye exhibits no discharge.  Cardiovascular: Normal rate, regular rhythm and  normal heart sounds. Exam reveals no gallop and no friction rub.  No murmur heard. Pulmonary/Chest: Effort normal and breath sounds normal. She has no wheezes.  Neurological: She is alert and oriented to person, place, and time.  Psychiatric: She has a normal mood and affect. Her behavior is normal.    Vitals:   02/04/17 1423  BP: 129/81  Pulse: 68  Temp: 97.8 F (36.6 C)        Assessment & Plan:   Donnamarie was seen today for sinus drainage.  Diagnoses and all orders for this visit:  Acute upper respiratory infection -     amoxicillin-clavulanate (AUGMENTIN) 875-125 MG tablet; Take 1 tablet by mouth 2 (two) times daily.  ETD (Eustachian tube dysfunction), bilateral -     methylPREDNISolone (MEDROL DOSEPAK) 4 MG TBPK tablet; Take as directed by package insert.     [name] has been given an educational hand out on upper respiratory infection. She has been instructed to continue flonase and complete her prescribed medrol dose pack. I discussed I did not see any bacterial findings;however I understand her frustration with not wanting a repeat of infections like last year. She has been recommended OTC medications like Tylenol Cold and Sinus Severe to help with her symptoms and has been given an antibiotic if the symptoms worsen and do not improve by Monday 02/07/17.  If she does worsen and has questions or concerns she can contact our office or send a message through [facility].

## 2017-02-04 NOTE — Patient Instructions (Addendum)
Tylenol cold sinus severe.  flonase daily.    Upper Respiratory Infection, Adult Most upper respiratory infections (URIs) are caused by a virus. A URI affects the nose, throat, and upper air passages. The most common type of URI is often called "the common cold." Follow these instructions at home:  Take medicines only as told by your doctor.  Gargle warm saltwater or take cough drops to comfort your throat as told by your doctor.  Use a warm mist humidifier or inhale steam from a shower to increase air moisture. This may make it easier to breathe.  Drink enough fluid to keep your pee (urine) clear or pale yellow.  Eat soups and other clear broths.  Have a healthy diet.  Rest as needed.  Go back to work when your fever is gone or your doctor says it is okay. ? You may need to stay home longer to avoid giving your URI to others. ? You can also wear a face mask and wash your hands often to prevent spread of the virus.  Use your inhaler more if you have asthma.  Do not use any tobacco products, including cigarettes, chewing tobacco, or electronic cigarettes. If you need help quitting, ask your doctor. Contact a doctor if:  You are getting worse, not better.  Your symptoms are not helped by medicine.  You have chills.  You are getting more short of breath.  You have [place] or red mucus.  You have yellow or [place] discharge from your nose.  You have pain in your face, especially when you bend forward.  You have a fever.  You have puffy (swollen) neck glands.  You have pain while swallowing.  You have white areas in the back of your throat. Get help right away if:  You have very bad or constant: ? Headache. ? Ear pain. ? Pain in your forehead, behind your eyes, and over your cheekbones (sinus pain). ? Chest pain.  You have long-lasting (chronic) lung disease and any of the following: ? Wheezing. ? Long-lasting cough. ? Coughing up blood. ? A change in your usual  mucus.  You have a stiff neck.  You have changes in your: ? Vision. ? Hearing. ? Thinking. ? Mood. This information is not intended to replace advice given to you by your health care provider. Make sure you discuss any questions you have with your health care provider. Document Released: 06/30/2007 Document Revised: 09/14/2015 Document Reviewed: 04/18/2013 Elsevier Interactive Patient Education  2018 [employer].

## 2017-02-21 ENCOUNTER — Encounter: Payer: Self-pay | Admitting: Physician Assistant

## 2017-02-22 ENCOUNTER — Encounter: Payer: Self-pay | Admitting: Physician Assistant

## 2017-02-22 ENCOUNTER — Ambulatory Visit: Payer: BLUE CROSS/BLUE SHIELD | Admitting: Physician Assistant

## 2017-02-22 VITALS — BP 108/61 | HR 61 | Ht 62.0 in | Wt 184.0 lb

## 2017-02-22 DIAGNOSIS — R0789 Other chest pain: Secondary | ICD-10-CM | POA: Diagnosis not present

## 2017-02-22 DIAGNOSIS — R05 Cough: Secondary | ICD-10-CM

## 2017-02-22 DIAGNOSIS — R221 Localized swelling, mass and lump, neck: Secondary | ICD-10-CM | POA: Diagnosis not present

## 2017-02-22 DIAGNOSIS — R058 Other specified cough: Secondary | ICD-10-CM

## 2017-02-23 MED ORDER — ALBUTEROL SULFATE HFA 108 (90 BASE) MCG/ACT IN AERS: 2.0000 | INHALATION_SPRAY | Freq: Four times a day (QID) | RESPIRATORY_TRACT | 1 refills | Status: DC | PRN

## 2017-02-23 NOTE — Progress Notes (Signed)
   Subjective:    Patient ID: Cynthia Christensen, female    DOB: April 29, 1985, 32 y.o.   MRN: [id number]  HPI Pt is a 32 yo female who presents to the clinic to follow up on persistent cough after URI and ETD diagnosed on 02/04/17. She took prednisone and eventually a few doses of augmentin but did not feel like it was helping. She feels much better but has a dry cough, sensation of lump in throat and chest tightness. She feels like she cannot get a deep breath. No fever, chills, sinus pressure, ear pain. No fever, body aches. She does admit that she has been having more reflux symptoms lately she contributed to the prednisone.   .. Active Ambulatory Problems    Diagnosis Date Noted  . Candidal skin infection 05/03/2012  . Tinea versicolor 05/03/2012  . Tobacco use 05/03/2012  . Hirsutism 05/22/2012  . Hyperlipidemia 05/22/2012  . Personal history of other specified conditions 08/07/2012  . Acne inversa 08/07/2012  . 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 Lip swelling 09/19/2015  . Tongue swelling 09/19/2015  . Allergic reaction to food 09/19/2015   Resolved Ambulatory Problems    Diagnosis Date Noted  . No Resolved Ambulatory Problems   Past Medical History:  Diagnosis Date  . History of HPV infection   . Tobacco use 05/03/2012      Review of Systems  All other systems reviewed and are negative.      Objective:   Physical Exam  Constitutional: She is oriented to person, place, and time. She appears well-developed and well-nourished.  HENT:  Head: Normocephalic and atraumatic.  Right Ear: External ear normal.  Left Ear: External ear normal.  Nose: Nose normal.  Mouth/Throat: Oropharynx is clear and moist. No oropharyngeal exudate.  Eyes: Conjunctivae are normal. Right eye exhibits no discharge. Left eye exhibits no discharge.  Neck: Normal range of motion. Neck supple.  Cardiovascular: Normal rate, regular rhythm and normal heart sounds.  Pulmonary/Chest: Effort normal and breath sounds  normal. She has no wheezes.  Lymphadenopathy:    She has no cervical adenopathy.  Neurological: She is alert and oriented to person, place, and time.  Psychiatric: She has a normal mood and affect. Her behavior is normal.          Assessment & Plan:  Marland KitchenMarland KitchenSaquoia was seen today for cough.  Diagnoses and all orders for this visit:  Post-viral cough syndrome -     albuterol (PROVENTIL HFA;VENTOLIN HFA) 108 (90 Base) MCG/ACT inhaler; Inhale 2 puffs into the lungs every 6 (six) hours as needed for wheezing or shortness of breath. -     DG Chest 2 View; Future  Sensation of lump in throat  Chest tightness  duoneb given in office.   Pt got great relief with duoneb in office. She stated she could get a deep breath and chest tightness and sensation of lump in throat was gone. Sent home with albuterol 2 puffs as needed. Discussed side effects. If not resolved in the next 2-3 days start PPI(omeprazole) 40mg  in am for 7-14 days to see if cough could be reflux related. If still not improving please come in for CXR. Continue to suck on hard candy and use atrovent nasal spray as needed.

## 2017-02-28 ENCOUNTER — Other Ambulatory Visit: Payer: Self-pay | Admitting: Physician Assistant

## 2017-02-28 NOTE — Telephone Encounter (Signed)
Can we find out how her cough Is?   After albuterol was given she got so much relief. I wanted to make sure she didn't need cough syrup of anything else.

## 2017-02-28 NOTE — Telephone Encounter (Signed)
Called and spoke with Pt, she is doing better. No further Rx's needed at this time. Advised to contact clinic if anything changes, verbalized understanding.

## 2017-04-26 DIAGNOSIS — Z6834 Body mass index (BMI) 34.0-34.9, adult: Secondary | ICD-10-CM | POA: Diagnosis not present

## 2017-04-26 DIAGNOSIS — Z01419 Encounter for gynecological examination (general) (routine) without abnormal findings: Secondary | ICD-10-CM | POA: Diagnosis not present

## 2017-05-30 DIAGNOSIS — Z3200 Encounter for pregnancy test, result unknown: Secondary | ICD-10-CM | POA: Diagnosis not present

## 2017-05-30 DIAGNOSIS — Z3141 Encounter for fertility testing: Secondary | ICD-10-CM | POA: Diagnosis not present

## 2017-06-17 DIAGNOSIS — B373 Candidiasis of vulva and vagina: Secondary | ICD-10-CM | POA: Diagnosis not present

## 2017-07-26 DIAGNOSIS — Z3169 Encounter for other general counseling and advice on procreation: Secondary | ICD-10-CM | POA: Diagnosis not present

## 2017-08-23 DIAGNOSIS — Z3141 Encounter for fertility testing: Secondary | ICD-10-CM | POA: Diagnosis not present

## 2017-08-23 DIAGNOSIS — Z119 Encounter for screening for infectious and parasitic diseases, unspecified: Secondary | ICD-10-CM | POA: Diagnosis not present

## 2017-09-08 DIAGNOSIS — Z3141 Encounter for fertility testing: Secondary | ICD-10-CM | POA: Diagnosis not present

## 2017-09-12 DIAGNOSIS — Z3141 Encounter for fertility testing: Secondary | ICD-10-CM | POA: Diagnosis not present

## 2017-09-14 DIAGNOSIS — Z3141 Encounter for fertility testing: Secondary | ICD-10-CM | POA: Diagnosis not present

## 2017-09-14 DIAGNOSIS — Z3183 Encounter for assisted reproductive fertility procedure cycle: Secondary | ICD-10-CM | POA: Diagnosis not present

## 2017-09-16 DIAGNOSIS — Z3141 Encounter for fertility testing: Secondary | ICD-10-CM | POA: Diagnosis not present

## 2017-09-19 DIAGNOSIS — N979 Female infertility, unspecified: Secondary | ICD-10-CM | POA: Diagnosis not present

## 2017-09-19 DIAGNOSIS — Z3183 Encounter for assisted reproductive fertility procedure cycle: Secondary | ICD-10-CM | POA: Diagnosis not present

## 2017-09-22 DIAGNOSIS — N981 Hyperstimulation of ovaries: Secondary | ICD-10-CM | POA: Diagnosis not present

## 2017-09-22 DIAGNOSIS — Z3141 Encounter for fertility testing: Secondary | ICD-10-CM | POA: Diagnosis not present

## 2017-10-20 DIAGNOSIS — Z3141 Encounter for fertility testing: Secondary | ICD-10-CM | POA: Diagnosis not present

## 2017-11-04 DIAGNOSIS — Z3200 Encounter for pregnancy test, result unknown: Secondary | ICD-10-CM | POA: Diagnosis not present

## 2017-11-08 DIAGNOSIS — Z3200 Encounter for pregnancy test, result unknown: Secondary | ICD-10-CM | POA: Diagnosis not present

## 2017-11-21 DIAGNOSIS — Z3490 Encounter for supervision of normal pregnancy, unspecified, unspecified trimester: Secondary | ICD-10-CM | POA: Diagnosis not present

## 2017-11-25 DIAGNOSIS — O09811 Supervision of pregnancy resulting from assisted reproductive technology, first trimester: Secondary | ICD-10-CM | POA: Diagnosis not present

## 2017-12-01 DIAGNOSIS — Z3401 Encounter for supervision of normal first pregnancy, first trimester: Secondary | ICD-10-CM | POA: Diagnosis not present

## 2017-12-01 DIAGNOSIS — O209 Hemorrhage in early pregnancy, unspecified: Secondary | ICD-10-CM | POA: Diagnosis not present

## 2017-12-01 DIAGNOSIS — O30031 Twin pregnancy, monochorionic/diamniotic, first trimester: Secondary | ICD-10-CM | POA: Diagnosis not present

## 2017-12-01 DIAGNOSIS — Z6834 Body mass index (BMI) 34.0-34.9, adult: Secondary | ICD-10-CM | POA: Diagnosis not present

## 2017-12-19 DIAGNOSIS — O039 Complete or unspecified spontaneous abortion without complication: Secondary | ICD-10-CM | POA: Diagnosis not present

## 2017-12-19 DIAGNOSIS — O30031 Twin pregnancy, monochorionic/diamniotic, first trimester: Secondary | ICD-10-CM | POA: Diagnosis not present

## 2017-12-19 DIAGNOSIS — Z3A10 10 weeks gestation of pregnancy: Secondary | ICD-10-CM | POA: Diagnosis not present

## 2017-12-19 DIAGNOSIS — O021 Missed abortion: Secondary | ICD-10-CM | POA: Diagnosis not present

## 2017-12-19 DIAGNOSIS — K219 Gastro-esophageal reflux disease without esophagitis: Secondary | ICD-10-CM | POA: Diagnosis not present

## 2017-12-19 DIAGNOSIS — Z79899 Other long term (current) drug therapy: Secondary | ICD-10-CM | POA: Diagnosis not present

## 2017-12-19 DIAGNOSIS — F17200 Nicotine dependence, unspecified, uncomplicated: Secondary | ICD-10-CM | POA: Diagnosis not present

## 2017-12-27 DIAGNOSIS — R309 Painful micturition, unspecified: Secondary | ICD-10-CM | POA: Diagnosis not present

## 2017-12-27 DIAGNOSIS — R300 Dysuria: Secondary | ICD-10-CM | POA: Diagnosis not present

## 2018-01-16 DIAGNOSIS — Z130 Encounter for screening for diseases of the blood and blood-forming organs and certain disorders involving the immune mechanism: Secondary | ICD-10-CM | POA: Diagnosis not present

## 2018-01-16 DIAGNOSIS — Z09 Encounter for follow-up examination after completed treatment for conditions other than malignant neoplasm: Secondary | ICD-10-CM | POA: Diagnosis not present

## 2018-02-07 DIAGNOSIS — Z8759 Personal history of other complications of pregnancy, childbirth and the puerperium: Secondary | ICD-10-CM | POA: Diagnosis not present

## 2018-02-07 DIAGNOSIS — N96 Recurrent pregnancy loss: Secondary | ICD-10-CM | POA: Diagnosis not present

## 2018-02-07 LAB — HM PAP SMEAR: HM Pap smear: NORMAL

## 2018-02-08 DIAGNOSIS — N96 Recurrent pregnancy loss: Secondary | ICD-10-CM | POA: Diagnosis not present

## 2018-02-10 ENCOUNTER — Ambulatory Visit (INDEPENDENT_AMBULATORY_CARE_PROVIDER_SITE_OTHER): Payer: BLUE CROSS/BLUE SHIELD | Admitting: Physician Assistant

## 2018-02-10 ENCOUNTER — Encounter: Payer: Self-pay | Admitting: Physician Assistant

## 2018-02-10 VITALS — BP 111/74 | HR 77 | Ht 62.0 in | Wt 201.0 lb

## 2018-02-10 DIAGNOSIS — K2900 Acute gastritis without bleeding: Secondary | ICD-10-CM

## 2018-02-10 MED ORDER — OMEPRAZOLE 40 MG PO CPDR: 40.0000 mg | DELAYED_RELEASE_CAPSULE | Freq: Every day | ORAL | 3 refills | Status: DC

## 2018-02-10 MED ORDER — ALUM & MAG HYDROXIDE-SIMETH 200-200-20 MG/5ML PO SUSP
30.0000 mL | Freq: Once | ORAL | Status: AC
  Administered 2018-02-10: 30 mL via ORAL

## 2018-02-10 MED ORDER — SUCRALFATE 1 G PO TABS: 1.0000 g | ORAL_TABLET | Freq: Four times a day (QID) | ORAL | 0 refills | Status: DC

## 2018-02-10 NOTE — Patient Instructions (Signed)
Gastritis, Adult    Gastritis is swelling (inflammation) of the stomach. Gastritis can develop quickly (acute). It can also develop slowly over time (chronic). It is important to get help for this condition. If you do not get help, your stomach can bleed, and you can get sores (ulcers) in your stomach.  What are the causes?  This condition may be caused by:   Germs that get to your stomach.   Drinking too much alcohol.   Medicines you are taking.   Too much acid in the stomach.   A disease of the intestines or stomach.   Stress.   An allergic reaction.   Crohn's disease.   Some cancer treatments (radiation).  Sometimes the cause of this condition is not known.  What are the signs or symptoms?  Symptoms of this condition include:   Pain in your stomach.   A burning feeling in your stomach.   Feeling sick to your stomach (nauseous).   Throwing up (vomiting).   Feeling too full after you eat.   Weight loss.   Bad breath.   Throwing up blood.   Blood in your poop (stool).  How is this diagnosed?  This condition may be diagnosed with:   Your medical history and symptoms.   A physical exam.   Tests. These can include:  ? Blood tests.  ? Stool tests.  ? A procedure to look inside your stomach (upper endoscopy).  ? A test in which a sample of tissue is taken for testing (biopsy).  How is this treated?  Treatment for this condition depends on what caused it. You may be given:   Antibiotic medicine, if your condition was caused by germs.   H2 blockers and similar medicines, if your condition was caused by too much acid.  Follow these instructions at home:  Medicines   Take over-the-counter and prescription medicines only as told by your doctor.   If you were prescribed an antibiotic medicine, take it as told by your doctor. Do not stop taking it even if you start to feel better.  Eating and drinking     Eat small meals often, instead of large meals.   Avoid foods and drinks that make your symptoms  worse.   Drink enough fluid to keep your pee (urine) pale yellow.  Alcohol use   Do not drink alcohol if:  ? Your doctor tells you not to drink.  ? You are pregnant, may be pregnant, or are planning to become pregnant.   If you drink alcohol:  ? Limit your use to:   0-1 drink a day for women.   0-2 drinks a day for men.  ? Be aware of how much alcohol is in your drink. In the [place] one drink equals one 12 oz bottle of beer (355 mL), one 5 oz glass of wine (148 mL), or one 1 oz glass of hard liquor (44 mL).  General instructions   Talk with your doctor about ways to manage stress. You can exercise or do deep breathing, meditation, or yoga.   Do not smoke or use products that have nicotine or tobacco. If you need help quitting, ask your doctor.   Keep all follow-up visits as told by your doctor. This is important.  Contact a doctor if:   Your symptoms get worse.   Your symptoms go away and then come back.  Get help right away if:   You throw up blood or something that looks like coffee   grounds.   You have black or dark red poop.   You throw up any time you try to drink fluids.   Your stomach pain gets worse.   You have a fever.   You do not feel better after one week.  Summary   Gastritis is swelling (inflammation) of the stomach.   You must get help for this condition. If you do not get help, your stomach can bleed, and you can get sores (ulcers).   This condition is diagnosed with medical history, physical exam, or tests.   You can be treated with medicines for germs or medicines to block too much acid in your stomach.  This information is not intended to replace advice given to you by your health care provider. Make sure you discuss any questions you have with your health care provider.  Document Released: 06/30/2007 Document Revised: 05/31/2017 Document Reviewed: 05/31/2017  Elsevier Interactive Patient Education  2019 [employer] Inc[employer]

## 2018-02-10 NOTE — Progress Notes (Signed)
   Subjective:    Patient ID: Cynthia Christensen, female    DOB: 08-25-1985, 33 y.o.   MRN: [id number]  HPI  Pt is a 33 yo female with recent hx of miscarriage and DNC with twins via IVF who presents to the clinic with epigastric pain, relux, vomiting for the last week. She admits that she has been taking loads of ibuprofen after [facility] and recently just had her first period. She has had one week of symptoms and tried tums and OTC priolsec and helps some. Denies any tarry stools other than when she took peto bismal. She feels really full and bloated. She has vomited a few times due to symptoms. She admits her diet is horrible. No fever, chills, body aches.   She is trying again for IVF in the near future.  .. Active Ambulatory Problems    Diagnosis Date Noted  . Candidal skin infection 05/03/2012  . Tinea versicolor 05/03/2012  . Tobacco use 05/03/2012  . Hirsutism 05/22/2012  . Hyperlipidemia 05/22/2012  . Personal history of other specified conditions 08/07/2012  . Acne inversa 08/07/2012  . 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 Lip swelling 09/19/2015  . Tongue swelling 09/19/2015  . Allergic reaction to food 09/19/2015   Resolved Ambulatory Problems    Diagnosis Date Noted  . No Resolved Ambulatory Problems   Past Medical History:  Diagnosis Date  . History of HPV infection       Review of Systems See HPI.     Objective:   Physical Exam Vitals signs reviewed.  Constitutional:      Appearance: Normal appearance.  HENT:     Head: Normocephalic and atraumatic.  Cardiovascular:     Rate and Rhythm: Normal rate and regular rhythm.  Pulmonary:     Effort: Pulmonary effort is normal.     Breath sounds: Normal breath sounds.  Abdominal:     General: There is no distension.     Tenderness: There is abdominal tenderness. There is no guarding or rebound.  Neurological:     General: No focal deficit present.     Mental Status: She is alert and oriented to person, place, and time.   Psychiatric:        Mood and Affect: Mood normal.        Behavior: Behavior normal.           Assessment & Plan:  Marland KitchenMarland KitchenNola was seen today for heartburn.  Diagnoses and all orders for this visit:  Acute gastritis without hemorrhage, unspecified gastritis type -     omeprazole (PRILOSEC) 40 MG capsule; Take 1 capsule (40 mg total) by mouth daily. -     sucralfate (CARAFATE) 1 g tablet; Take 1 tablet (1 g total) by mouth 4 (four) times daily. -     alum & mag hydroxide-simeth (MAALOX/MYLANTA) 200-200-20 MG/5ML suspension 30 mL   Likely NSAID induced gastritis stop NSAID. Pt has already been taking PPI cannot do an accurate breath test. Given GI cocktail in clinic. Started omeprazole, carafate today. Avoid spicy foods. If not improving in ext 7 days please call office. ifdevelope tarry stools or bright red blood call immediately. Decided against labs today because patient has so much blood work done all the time with IVF and we have a clear reason for symptoms. Continue treatment plan for at least one month of both. Stop carafate first and then taper off omeprazole.   Follow up as needed.

## 2018-02-13 ENCOUNTER — Encounter: Payer: Self-pay | Admitting: Physician Assistant

## 2018-02-14 ENCOUNTER — Encounter: Payer: Self-pay | Admitting: Physician Assistant

## 2018-02-18 ENCOUNTER — Encounter: Payer: Self-pay | Admitting: Emergency Medicine

## 2018-02-18 ENCOUNTER — Emergency Department
Admission: EM | Admit: 2018-02-18 | Discharge: 2018-02-18 | Disposition: A | Discharge status: Home / Self Care | Payer: BLUE CROSS/BLUE SHIELD | Source: Home / Self Care

## 2018-02-18 DIAGNOSIS — R197 Diarrhea, unspecified: Secondary | ICD-10-CM

## 2018-02-18 DIAGNOSIS — R112 Nausea with vomiting, unspecified: Secondary | ICD-10-CM | POA: Diagnosis not present

## 2018-02-18 MED ORDER — ONDANSETRON HCL 4 MG PO TABS: 4.0000 mg | ORAL_TABLET | Freq: Four times a day (QID) | ORAL | 0 refills | Status: DC

## 2018-02-18 MED ORDER — ONDANSETRON 4 MG PO TBDP: 4.0000 mg | ORAL_TABLET | Freq: Once | ORAL | Status: DC

## 2018-02-18 NOTE — ED Triage Notes (Signed)
Patient c/o unable to keep anything down, vomiting, diarrhea, some abdominal cramping since 4am.  Patient recently being treated for Gastritis with Sucralfate and Omeprazole.

## 2018-02-18 NOTE — ED Provider Notes (Signed)
[facility]    CSN: [id number] Arrival date & time: 02/18/18  1251     History   Chief Complaint Chief Complaint  Patient presents with  . Emesis    HPI Cynthia Christensen is a 33 y.o. female.   HPI Cynthia Christensen is a 33 y.o. female presenting to UC with c/o nausea, vomiting and diarrhea with abdominal cramping that  Started around 4AM this morning.  She has had 4 episodes of vomiting and diarrhea. No blood or mucous in stool.  Denies fever or chills. She made spinach and mushrooms yesterday around 4PM and started having cramping around 8PM. Pt is unsure if the food made her sick. No sick contacts or recent travel.  No one else ate the same food she did.  She was treated last week by her PCP for gastritis with omeprazole and sucralfate.  Upper abdominal pain has resolved.   Past Medical History:  Diagnosis Date  . History of HPV infection   . Tobacco use 05/03/2012    Patient Active Problem List   Diagnosis Date Noted  . Lip swelling 09/19/2015  . Tongue swelling 09/19/2015  . Allergic reaction to food 09/19/2015  . 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 Personal history of other specified conditions 08/07/2012  . Acne inversa 08/07/2012  . Hirsutism 05/22/2012  . Hyperlipidemia 05/22/2012  . Candidal skin infection 05/03/2012  . Tinea versicolor 05/03/2012  . Tobacco use 05/03/2012    History reviewed. No pertinent surgical history.  OB History    Gravida  1   Para      Term      Preterm      AB  1   Living        SAB      TAB  1   Ectopic      Multiple      Live Births               Home Medications    Prior to Admission medications   Medication Sig Start Date End Date Taking? Authorizing Provider  albuterol (PROVENTIL HFA;VENTOLIN HFA) 108 (90 Base) MCG/ACT inhaler Inhale 2 puffs into the lungs every 6 (six) hours as needed for wheezing or shortness of breath. 02/23/17   Breeback, Jade L, PA-C  ipratropium (ATROVENT) 0.06 % nasal spray Place 1  spray into both nostrils 4 (four) times daily as needed. 06/09/16   Trixie Dredge, PA-C  metFORMIN (GLUCOPHAGE-XR) 750 MG 24 hr tablet Take 750 mg by mouth daily with breakfast.    [provider]  Omega-3 Fatty Acids (EQL FISH OIL) 1000 MG CAPS 1 by mouth in the evening. 07/22/15   Hommel, Sean, DO  omeprazole (PRILOSEC) 40 MG capsule Take 1 capsule (40 mg total) by mouth daily. 02/10/18   Breeback, Jade L, PA-C  ondansetron (ZOFRAN) 4 MG tablet Take 1 tablet (4 mg total) by mouth every 6 (six) hours. 02/18/18   Noe Gens, PA-C  sucralfate (CARAFATE) 1 g tablet Take 1 tablet (1 g total) by mouth 4 (four) times daily. 02/10/18   Breeback, Royetta Car, PA-C  traMADol (ULTRAM) 50 MG tablet Take 1 tablet (50 mg total) by mouth every 6 (six) hours as needed. 06/12/16   Noe Gens, PA-C    Family History Family History  Problem Relation Age of Onset  . Lung cancer Other        grandmother  . Heart attack Other        grandfather  .  Thyroid disease Mother     Social History Social History   Tobacco Use  . Smoking status: Current Every Day Smoker    Packs/day: 0.50    Years: 5.00    Pack years: 2.50    Types: Cigarettes  . Smokeless tobacco: Never Used  Substance Use Topics  . Alcohol use: Yes  . Drug use: Yes     Allergies   Patient has no known allergies.   Review of Systems Review of Systems  Constitutional: Negative for chills, diaphoresis and fever.  Gastrointestinal: Positive for abdominal pain, diarrhea, nausea and vomiting. Negative for blood in stool.  Genitourinary: Negative for dysuria, frequency and hematuria.  Musculoskeletal: Negative for back pain and myalgias.  Neurological: Negative for dizziness and headaches.     Physical Exam Triage Vital Signs ED Triage Vitals [02/18/18 1312]  Enc Vitals Group     BP 107/75     Pulse Rate 90     Resp      Temp 98.1 F (36.7 C)     Temp Source Oral     SpO2 97 %     Weight 195 lb 8 oz (88.7  kg)     Height 5\' 2"  (1.575 m)     Head Circumference      Peak Flow      Pain Score 7     Pain Loc      Pain Edu?      Excl. in [place]?    No data found.  Updated Vital Signs BP 107/75 (BP Location: Right Arm)   Pulse 90   Temp 98.1 F (36.7 C) (Oral)   Ht 5\' 2"  (1.575 m)   Wt 195 lb 8 oz (88.7 kg)   LMP 01/27/2018   SpO2 97%   BMI 35.76 kg/m   Visual Acuity Right Eye Distance:   Left Eye Distance:   Bilateral Distance:    Right Eye Near:   Left Eye Near:    Bilateral Near:     Physical Exam Vitals signs and nursing note reviewed.  Constitutional:      Appearance: Normal appearance. She is well-developed.  HENT:     Head: Normocephalic and atraumatic.  Neck:     Musculoskeletal: Normal range of motion and neck supple.  Cardiovascular:     Rate and Rhythm: Normal rate and regular rhythm.  Pulmonary:     Effort: Pulmonary effort is normal. No respiratory distress.     Breath sounds: Normal breath sounds. No stridor. No wheezing or rhonchi.  Abdominal:     General: There is no distension.     Palpations: Abdomen is soft.     Tenderness: There is no abdominal tenderness. There is no right CVA tenderness or left CVA tenderness.  Musculoskeletal: Normal range of motion.  Skin:    General: Skin is warm and Christensen.  Neurological:     Mental Status: She is alert and oriented to person, place, and time.  Psychiatric:        Behavior: Behavior normal.      UC Treatments / Results  Labs (all labs ordered are listed, but only abnormal results are displayed) Labs Reviewed - No data to display  EKG None  Radiology No results found.  Procedures Procedures (including critical care time)  Medications Ordered in UC Medications  ondansetron (ZOFRAN-ODT) disintegrating tablet 4 mg (has no administration in time range)    Initial Impression / Assessment and Plan / UC Course  I have reviewed the  triage vital signs and the nursing notes.  Pertinent labs & imaging  results that were available during my care of the patient were reviewed by me and considered in my medical decision making (see chart for details).     Benign abdominal exam. Vitals: WNL Hx and exam c/w gastroenteritis. Possibly from food vs viral. Will hold off on labs at this time due to no localized tenderness, pt is afebrile.  May need labs if not continuing to improve. Home care info provided.  Final Clinical Impressions(s) / UC Diagnoses   Final diagnoses:  Nausea vomiting and diarrhea     Discharge Instructions       Be sure to get a lot of rest and stay well hydrated with sports drinks, water, diluted juices, and clear sodas.  Avoid fried fatty food, spicy food, and milk as these foods can cause worsening stomach upset.   Please follow up with family medicine in 2-3 days if not improving.  Call 911 or go to the hospital if symptoms worsening- severe abdominal pain, blood in vomit or stool, dizziness/passing out, or other new concerning symptoms develop.    ED Prescriptions    Medication Sig Dispense Auth. Provider   ondansetron (ZOFRAN) 4 MG tablet Take 1 tablet (4 mg total) by mouth every 6 (six) hours. 12 tablet Noe Gens, PA-C     Controlled Substance Prescriptions [place] Controlled Substance Registry consulted? Not Applicable   Tyrell Antonio 02/18/18 1453

## 2018-02-18 NOTE — Discharge Instructions (Signed)
° °  Be sure to get a lot of rest and stay well hydrated with sports drinks, water, diluted juices, and clear sodas.  Avoid fried fatty food, spicy food, and milk as these foods can cause worsening stomach upset.   Please follow up with family medicine in 2-3 days if not improving.  Call 911 or go to the hospital if symptoms worsening- severe abdominal pain, blood in vomit or stool, dizziness/passing out, or other new concerning symptoms develop.

## 2018-03-10 DIAGNOSIS — Z3141 Encounter for fertility testing: Secondary | ICD-10-CM | POA: Diagnosis not present

## 2018-03-29 ENCOUNTER — Ambulatory Visit: Payer: BLUE CROSS/BLUE SHIELD | Admitting: Family Medicine

## 2018-03-29 ENCOUNTER — Encounter: Payer: Self-pay | Admitting: Family Medicine

## 2018-03-29 VITALS — BP 107/69 | HR 66 | Temp 98.0°F | Ht 62.0 in | Wt 190.0 lb

## 2018-03-29 DIAGNOSIS — R058 Other specified cough: Secondary | ICD-10-CM

## 2018-03-29 DIAGNOSIS — J0100 Acute maxillary sinusitis, unspecified: Secondary | ICD-10-CM | POA: Diagnosis not present

## 2018-03-29 DIAGNOSIS — R05 Cough: Secondary | ICD-10-CM

## 2018-03-29 MED ORDER — AZELASTINE HCL 0.1 % NA SOLN: 1.0000 | Freq: Two times a day (BID) | NASAL | 12 refills | Status: DC

## 2018-03-29 MED ORDER — CEFDINIR 300 MG PO CAPS: 300.0000 mg | ORAL_CAPSULE | Freq: Two times a day (BID) | ORAL | 0 refills | Status: DC

## 2018-03-29 MED ORDER — ALBUTEROL SULFATE HFA 108 (90 BASE) MCG/ACT IN AERS: 2.0000 | INHALATION_SPRAY | Freq: Four times a day (QID) | RESPIRATORY_TRACT | 1 refills | Status: AC | PRN

## 2018-03-29 NOTE — Patient Instructions (Addendum)
Thank you for coming in today. Maximize over the counter medicine.  Take claritin or zyrtec daily for allergies.  Use the astelin nasal spray if ok with RE.  If you worsen we can do more including codeine containing cough medicine or even prednisone.     Sinusitis, Adult Sinusitis is inflammation of your sinuses. Sinuses are hollow spaces in the bones around your face. Your sinuses are located:  Around your eyes.  In the middle of your forehead.  Behind your nose.  In your cheekbones. Mucus normally drains out of your sinuses. When your nasal tissues become inflamed or swollen, mucus can become trapped or blocked. This allows bacteria, viruses, and fungi to grow, which leads to infection. Most infections of the sinuses are caused by a virus. Sinusitis can develop quickly. It can last for up to 4 weeks (acute) or for more than 12 weeks (chronic). Sinusitis often develops after a cold. What are the causes? This condition is caused by anything that creates swelling in the sinuses or stops mucus from draining. This includes:  Allergies.  Asthma.  Infection from bacteria or viruses.  Deformities or blockages in your nose or sinuses.  Abnormal growths in the nose (nasal polyps).  Pollutants, such as chemicals or irritants in the air.  Infection from fungi (rare). What increases the risk? You are more likely to develop this condition if you:  Have a weak body defense system (immune system).  Do a lot of swimming or diving.  Overuse nasal sprays.  Smoke. What are the signs or symptoms? The main symptoms of this condition are pain and a feeling of pressure around the affected sinuses. Other symptoms include:  Stuffy nose or congestion.  Thick drainage from your nose.  Swelling and warmth over the affected sinuses.  Headache.  Upper toothache.  A cough that may get worse at night.  Extra mucus that collects in the throat or the back of the nose (postnasal  drip).  Decreased sense of smell and taste.  Fatigue.  A fever.  Sore throat.  Bad breath. How is this diagnosed? This condition is diagnosed based on:  Your symptoms.  Your medical history.  A physical exam.  Tests to find out if your condition is acute or chronic. This may include: ? Checking your nose for nasal polyps. ? Viewing your sinuses using a device that has a light (endoscope). ? Testing for allergies or bacteria. ? Imaging tests, such as an MRI or CT scan. In rare cases, a bone biopsy may be done to rule out more serious types of fungal sinus disease. How is this treated? Treatment for sinusitis depends on the cause and whether your condition is chronic or acute.  If caused by a virus, your symptoms should go away on their own within 10 days. You may be given medicines to relieve symptoms. They include: ? Medicines that shrink swollen nasal passages (topical intranasal decongestants). ? Medicines that treat allergies (antihistamines). ? A spray that eases inflammation of the nostrils (topical intranasal corticosteroids). ? Rinses that help get rid of thick mucus in your nose (nasal saline washes).  If caused by bacteria, your health care provider may recommend waiting to see if your symptoms improve. Most bacterial infections will get better without antibiotic medicine. You may be given antibiotics if you have: ? A severe infection. ? A weak immune system.  If caused by narrow nasal passages or nasal polyps, you may need to have surgery. Follow these instructions at home: Medicines  Take, use, or apply over-the-counter and prescription medicines only as told by your health care provider. These may include nasal sprays.  If you were prescribed an antibiotic medicine, take it as told by your health care provider. Do not stop taking the antibiotic even if you start to feel better. Hydrate and humidify   Drink enough fluid to keep your urine pale yellow.  Staying hydrated will help to thin your mucus.  Use a cool mist humidifier to keep the humidity level in your home above 50%.  Inhale steam for 10-15 minutes, 3-4 times a day, or as told by your health care provider. You can do this in the bathroom while a hot shower is running.  Limit your exposure to cool or dry air. Rest  Rest as much as possible.  Sleep with your head raised (elevated).  Make sure you get enough sleep each night. General instructions   Apply a warm, moist washcloth to your face 3-4 times a day or as told by your health care provider. This will help with discomfort.  Wash your hands often with soap and water to reduce your exposure to germs. If soap and water are not available, use hand sanitizer.  Do not smoke. Avoid being around people who are smoking (secondhand smoke).  Keep all follow-up visits as told by your health care provider. This is important. Contact a health care provider if:  You have a fever.  Your symptoms get worse.  Your symptoms do not improve within 10 days. Get help right away if:  You have a severe headache.  You have persistent vomiting.  You have severe pain or swelling around your face or eyes.  You have vision problems.  You develop confusion.  Your neck is stiff.  You have trouble breathing. Summary  Sinusitis is soreness and inflammation of your sinuses. Sinuses are hollow spaces in the bones around your face.  This condition is caused by nasal tissues that become inflamed or swollen. The swelling traps or blocks the flow of mucus. This allows bacteria, viruses, and fungi to grow, which leads to infection.  If you were prescribed an antibiotic medicine, take it as told by your health care provider. Do not stop taking the antibiotic even if you start to feel better.  Keep all follow-up visits as told by your health care provider. This is important. This information is not intended to replace advice given to you by  your health care provider. Make sure you discuss any questions you have with your health care provider. Document Released: 01/11/2005 Document Revised: 06/13/2017 Document Reviewed: 06/13/2017 Elsevier Interactive Patient Education  2019 [employer]   Acute Bronchitis, Adult  Acute bronchitis is sudden (acute) swelling of the air tubes (bronchi) in the lungs. Acute bronchitis causes these tubes to fill with mucus, which can make it hard to breathe. It can also cause coughing or wheezing. In adults, acute bronchitis usually goes away within 2 weeks. A cough caused by bronchitis may last up to 3 weeks. Smoking, allergies, and asthma can make the condition worse. Repeated episodes of bronchitis may cause further lung problems, such as chronic obstructive pulmonary disease (COPD). What are the causes? This condition can be caused by germs and by substances that irritate the lungs, including:  Cold and flu viruses. This condition is most often caused by the same virus that causes a cold.  Bacteria.  Exposure to tobacco smoke, dust, fumes, and air pollution. What increases the risk? This condition is more  likely to develop in people who:  Have close contact with someone with acute bronchitis.  Are exposed to lung irritants, such as tobacco smoke, dust, fumes, and vapors.  Have a weak immune system.  Have a respiratory condition such as asthma. What are the signs or symptoms? Symptoms of this condition include:  A cough.  Coughing up clear, yellow, or green mucus.  Wheezing.  Chest congestion.  Shortness of breath.  A fever.  Body aches.  Chills.  A sore throat. How is this diagnosed? This condition is usually diagnosed with a physical exam. During the exam, your health care provider may order tests, such as chest X-rays, to rule out other conditions. He or she may also:  Test a sample of your mucus for bacterial infection.  Check the level of oxygen in your blood.  This is done to check for pneumonia.  Do a chest X-ray or lung function testing to rule out pneumonia and other conditions.  Perform blood tests. Your health care provider will also ask about your symptoms and medical history. How is this treated? Most cases of acute bronchitis clear up over time without treatment. Your health care provider may recommend:  Drinking more fluids. Drinking more makes your mucus thinner, which may make it easier to breathe.  Taking a medicine for a fever or cough.  Taking an antibiotic medicine.  Using an inhaler to help improve shortness of breath and to control a cough.  Using a cool mist vaporizer or humidifier to make it easier to breathe. Follow these instructions at home: Medicines  Take over-the-counter and prescription medicines only as told by your health care provider.  If you were prescribed an antibiotic, take it as told by your health care provider. Do not stop taking the antibiotic even if you start to feel better. General instructions   Get plenty of rest.  Drink enough fluids to keep your urine pale yellow.  Avoid smoking and secondhand smoke. Exposure to cigarette smoke or irritating chemicals will make bronchitis worse. If you smoke and you need help quitting, ask your health care provider. Quitting smoking will help your lungs heal faster.  Use an inhaler, cool mist vaporizer, or humidifier as told by your health care provider.  Keep all follow-up visits as told by your health care provider. This is important. How is this prevented? To lower your risk of getting this condition again:  Wash your hands often with soap and water. If soap and water are not available, use hand sanitizer.  Avoid contact with people who have cold symptoms.  Try not to touch your hands to your mouth, nose, or eyes.  Make sure to get the flu shot every year. Contact a health care provider if:  Your symptoms do not improve in 2 weeks of  treatment. Get help right away if:  You cough up blood.  You have chest pain.  You have severe shortness of breath.  You become dehydrated.  You faint or keep feeling like you are going to faint.  You keep vomiting.  You have a severe headache.  Your fever or chills gets worse. This information is not intended to replace advice given to you by your health care provider. Make sure you discuss any questions you have with your health care provider. Document Released: 02/19/2004 Document Revised: 08/25/2016 Document Reviewed: 07/02/2015 Elsevier Interactive Patient Education  2019 [employer].

## 2018-03-29 NOTE — Progress Notes (Signed)
Cynthia Christensen is a 33 y.o. female who presents to [facility] [place] today for runny nose congestion sinus discharge and pressure.  Symptoms started about 36 hours prior to presentation in clinic.  She notes that she has been using Mucinex and Nettie pot and some Flonase nasal spray which helps a bit.  She thinks her symptoms are somewhat consistent with early allergies but she is worried that she is developing sinusitis or bronchitis.  She also notes that she is about to start a round of IVF and cannot use Flonase nasal spray beyond about 1 week.   ROS as above:  Exam:  BP 107/69   Pulse 66   Temp 98 F (36.7 C) (Oral)   Ht 5\' 2"  (1.575 m)   Wt 190 lb (86.2 kg)   SpO2 99%   BMI 34.75 kg/m  Wt Readings from Last 5 Encounters:  03/29/18 190 lb (86.2 kg)  02/18/18 195 lb 8 oz (88.7 kg)  02/10/18 201 lb (91.2 kg)  02/22/17 184 lb (83.5 kg)  02/04/17 184 lb (83.5 kg)    Gen: Well NAD HEENT: EOMI,  MMM clear nasal discharge.  Inflamed nasal turbinates bilaterally.  Mildly tender palpation right maxillary sinus.  Posterior pharynx with cobblestoning.  Normal tympanic membranes bilaterally.  Mild cervical lymphadenopathy bilaterally. Lungs: Normal work of breathing.  Coarse breath sounds present bilaterally. Heart: RRR no MRG Abd: NABS, Soft. Nondistended, Nontender Exts: Brisk capillary refill, warm and well perfused.   Lab and Radiology Results No results found for this or any previous visit (from the past 72 hour(s)). No results found.    Assessment and Plan: 33 y.o. female with likely allergic rhinitis with possible early second sickening.  To treat with allergy medication such as Claritin or Allegra.  Additionally use Astelin nasal spray.  Will use albuterol for slight coarse breath sounds and cough as patient may be developing early bronchitis.   I have printed and  prescribed a backup Omnicef antibiotic for use if worsening.  PDMP not reviewed this encounter. No orders of the defined types were placed in this encounter.  Meds ordered this encounter  Medications  . DISCONTD: azelastine (ASTELIN) 0.1 % nasal spray    Sig: Place 1 spray into both nostrils 2 (two) times daily. Use in each nostril as directed    Dispense:  30 mL    Refill:  12  . cefdinir (OMNICEF) 300 MG capsule    Sig: Take 1 capsule (300 mg total) by mouth 2 (two) times daily. If worse    Dispense:  14 capsule    Refill:  0  . azelastine (ASTELIN) 0.1 % nasal spray    Sig: Place 1 spray into both nostrils 2 (two) times daily. Use in each nostril as directed    Dispense:  30 mL    Refill:  12    Tried and failed flonase.  . albuterol (PROVENTIL HFA;VENTOLIN HFA) 108 (90 Base) MCG/ACT inhaler    Sig: Inhale 2 puffs into the lungs every 6 (six) hours as needed for wheezing or shortness of breath.    Dispense:  1 Inhaler    Refill:  1     Historical information moved to improve visibility of documentation.  Past Medical History:  Diagnosis Date  . History of HPV infection   . Tobacco use 05/03/2012   No past surgical history on file. Social History   Tobacco Use  . Smoking  status: Current Every Day Smoker    Packs/day: 0.50    Years: 5.00    Pack years: 2.50    Types: Cigarettes  . Smokeless tobacco: Never Used  Substance Use Topics  . Alcohol use: Yes   family history includes Heart attack in an other family member; Lung cancer in an other family member; Thyroid disease in her mother.  Medications: Current Outpatient Medications  Medication Sig Dispense Refill  . albuterol (PROVENTIL HFA;VENTOLIN HFA) 108 (90 Base) MCG/ACT inhaler Inhale 2 puffs into the lungs every 6 (six) hours as needed for wheezing or shortness of breath. 1 Inhaler 1  . ipratropium (ATROVENT) 0.06 % nasal spray Place 1 spray into both nostrils 4 (four) times daily as needed. 15 mL 0  .  metFORMIN (GLUCOPHAGE-XR) 750 MG 24 hr tablet Take 750 mg by mouth daily with breakfast.    . Omega-3 Fatty Acids (EQL FISH OIL) 1000 MG CAPS 1 by mouth in the evening. 90 each   . omeprazole (PRILOSEC) 40 MG capsule Take 1 capsule (40 mg total) by mouth daily. 30 capsule 3  . ondansetron (ZOFRAN) 4 MG tablet Take 1 tablet (4 mg total) by mouth every 6 (six) hours. 12 tablet 0  . sucralfate (CARAFATE) 1 g tablet Take 1 tablet (1 g total) by mouth 4 (four) times daily. 120 tablet 0  . traMADol (ULTRAM) 50 MG tablet Take 1 tablet (50 mg total) by mouth every 6 (six) hours as needed. 12 tablet 0  . azelastine (ASTELIN) 0.1 % nasal spray Place 1 spray into both nostrils 2 (two) times daily. Use in each nostril as directed 30 mL 12  . cefdinir (OMNICEF) 300 MG capsule Take 1 capsule (300 mg total) by mouth 2 (two) times daily. If worse 14 capsule 0   No current facility-administered medications for this visit.    No Known Allergies   Discussed warning signs or symptoms. Please see discharge instructions. Patient expresses understanding.

## 2018-05-22 ENCOUNTER — Encounter: Payer: Self-pay | Admitting: Physician Assistant

## 2018-07-03 DIAGNOSIS — K819 Cholecystitis, unspecified: Secondary | ICD-10-CM | POA: Diagnosis not present

## 2018-07-03 DIAGNOSIS — Z87891 Personal history of nicotine dependence: Secondary | ICD-10-CM | POA: Diagnosis not present

## 2018-07-03 DIAGNOSIS — Z79899 Other long term (current) drug therapy: Secondary | ICD-10-CM | POA: Diagnosis not present

## 2018-07-03 DIAGNOSIS — K8020 Calculus of gallbladder without cholecystitis without obstruction: Secondary | ICD-10-CM | POA: Diagnosis not present

## 2018-07-03 DIAGNOSIS — R109 Unspecified abdominal pain: Secondary | ICD-10-CM | POA: Diagnosis not present

## 2018-07-03 DIAGNOSIS — F419 Anxiety disorder, unspecified: Secondary | ICD-10-CM | POA: Diagnosis not present

## 2018-07-03 DIAGNOSIS — R079 Chest pain, unspecified: Secondary | ICD-10-CM | POA: Diagnosis not present

## 2018-07-03 DIAGNOSIS — Z1159 Encounter for screening for other viral diseases: Secondary | ICD-10-CM | POA: Diagnosis not present

## 2018-07-03 DIAGNOSIS — J45909 Unspecified asthma, uncomplicated: Secondary | ICD-10-CM | POA: Diagnosis not present

## 2018-07-03 DIAGNOSIS — K810 Acute cholecystitis: Secondary | ICD-10-CM | POA: Diagnosis not present

## 2018-07-03 DIAGNOSIS — M549 Dorsalgia, unspecified: Secondary | ICD-10-CM | POA: Diagnosis not present

## 2018-07-03 DIAGNOSIS — I499 Cardiac arrhythmia, unspecified: Secondary | ICD-10-CM | POA: Diagnosis not present

## 2018-07-03 DIAGNOSIS — Z20828 Contact with and (suspected) exposure to other viral communicable diseases: Secondary | ICD-10-CM | POA: Diagnosis not present

## 2018-07-03 DIAGNOSIS — R1013 Epigastric pain: Secondary | ICD-10-CM | POA: Diagnosis not present

## 2018-07-04 DIAGNOSIS — K819 Cholecystitis, unspecified: Secondary | ICD-10-CM | POA: Diagnosis not present

## 2018-07-04 DIAGNOSIS — R079 Chest pain, unspecified: Secondary | ICD-10-CM | POA: Diagnosis not present

## 2018-07-04 DIAGNOSIS — K8010 Calculus of gallbladder with chronic cholecystitis without obstruction: Secondary | ICD-10-CM | POA: Diagnosis not present

## 2018-07-04 DIAGNOSIS — M549 Dorsalgia, unspecified: Secondary | ICD-10-CM | POA: Diagnosis not present

## 2018-07-04 DIAGNOSIS — R1013 Epigastric pain: Secondary | ICD-10-CM | POA: Diagnosis not present

## 2018-07-06 ENCOUNTER — Encounter: Payer: Self-pay | Admitting: Physician Assistant

## 2018-07-07 NOTE — Telephone Encounter (Signed)
Sent to the front to schedule

## 2018-07-07 NOTE — Telephone Encounter (Signed)
I have scheduled pt for [facility] next week and pt aware

## 2018-07-11 DIAGNOSIS — Z3141 Encounter for fertility testing: Secondary | ICD-10-CM | POA: Diagnosis not present

## 2018-07-12 ENCOUNTER — Ambulatory Visit (INDEPENDENT_AMBULATORY_CARE_PROVIDER_SITE_OTHER): Payer: BC Managed Care – PPO | Admitting: Physician Assistant

## 2018-07-12 ENCOUNTER — Encounter: Payer: Self-pay | Admitting: Physician Assistant

## 2018-07-12 VITALS — BP 116/77 | HR 67 | Temp 98.1°F | Ht 62.0 in | Wt 179.0 lb

## 2018-07-12 DIAGNOSIS — E6609 Other obesity due to excess calories: Secondary | ICD-10-CM | POA: Diagnosis not present

## 2018-07-12 DIAGNOSIS — Z9049 Acquired absence of other specified parts of digestive tract: Secondary | ICD-10-CM

## 2018-07-12 DIAGNOSIS — Z6832 Body mass index (BMI) 32.0-32.9, adult: Secondary | ICD-10-CM

## 2018-07-12 DIAGNOSIS — R0683 Snoring: Secondary | ICD-10-CM

## 2018-07-12 DIAGNOSIS — R0681 Apnea, not elsewhere classified: Secondary | ICD-10-CM

## 2018-07-12 DIAGNOSIS — G478 Other sleep disorders: Secondary | ICD-10-CM | POA: Diagnosis not present

## 2018-07-12 NOTE — Progress Notes (Signed)
   Subjective:    Patient ID: Cynthia Christensen, female    DOB: [birth year], 33 y.o.   MRN: [id number]  HPI  Pt is a 33 yo obese female s/p recent cholecystectomy who is actively undergoing fertility treatments who presents to the clinic to discuss screening for sleep apnea. She was told after surgery that she had apena events. She has also been told 2 times in the past that she stopped breathing at night in her sleep. She admits to snoring and waking up feeling not rested. She is actively trying to lose weight. She lost 20lbs in the last 6 months.   She plans on getting another round of egg placement in next few months.   .. Active Ambulatory Problems    Diagnosis Date Noted  . Candidal skin infection 05/03/2012  . Tinea versicolor 05/03/2012  . Tobacco use 05/03/2012  . Hirsutism 05/22/2012  . Hyperlipidemia 05/22/2012  . Personal history of other specified conditions 08/07/2012  . Acne inversa 08/07/2012  . 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 Lip swelling 09/19/2015  . Tongue swelling 09/19/2015  . Allergic reaction to food 09/19/2015  . Status post cholecystectomy 07/12/2018  . Witnessed episode of apnea 07/12/2018  . Class 1 obesity due to excess calories without serious comorbidity with body mass index (BMI) of 32.0 to 32.9 in adult 07/12/2018  . 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 Snoring 07/12/2018   Resolved Ambulatory Problems    Diagnosis Date Noted  . No Resolved Ambulatory Problems   Past Medical History:  Diagnosis Date  . History of HPV infection       Review of Systems  All other systems reviewed and are negative.      Objective:   Physical Exam Vitals signs reviewed.  Constitutional:      Appearance: Normal appearance. She is obese.  HENT:     Head: Normocephalic.  Cardiovascular:     Rate and Rhythm: Normal rate and regular rhythm.     Pulses: Normal pulses.  Pulmonary:     Effort: Pulmonary effort is normal.     Breath sounds: Normal breath sounds.   Abdominal:     General: There is distension.     Tenderness: There is abdominal tenderness.     Comments: 4 well healed incisions with some bruising. Tenderness over entire abdomen.   Neurological:     General: No focal deficit present.     Mental Status: She is alert and oriented to person, place, and time.  Psychiatric:        Mood and Affect: Mood normal.           Assessment & Plan:  Marland KitchenMarland KitchenMarcina was seen today for snoring.  Diagnoses and all orders for this visit:  Snoring -     Split night study  Non-restorative sleep -     Split night study  Class 1 obesity due to excess calories without serious comorbidity with body mass index (BMI) of 32.0 to 32.9 in adult -     Split night study  Witnessed episode of apnea -     Split night study  Status post cholecystectomy   STOP BANG high risk at 3 yes. Ordered sleep study.  Discussed continuing to work on weight loss can treat snoring/sleep apnea. She has lost 20lbs goal would be BMI under 27.  Will follow up after testing.   Pt is doing well after cholecystectomy.

## 2018-07-20 ENCOUNTER — Telehealth: Payer: Self-pay | Admitting: Neurology

## 2018-07-20 DIAGNOSIS — Z6832 Body mass index (BMI) 32.0-32.9, adult: Secondary | ICD-10-CM

## 2018-07-20 DIAGNOSIS — E6609 Other obesity due to excess calories: Secondary | ICD-10-CM

## 2018-07-20 DIAGNOSIS — R0681 Apnea, not elsewhere classified: Secondary | ICD-10-CM

## 2018-07-20 DIAGNOSIS — R0683 Snoring: Secondary | ICD-10-CM

## 2018-07-20 DIAGNOSIS — G478 Other sleep disorders: Secondary | ICD-10-CM

## 2018-07-20 NOTE — Telephone Encounter (Signed)
[facility] called [phone]) and state that her sleep study was denied by insurance, but they will approve a home sleep study. Please advise.

## 2018-07-21 ENCOUNTER — Encounter: Payer: Self-pay | Admitting: Physician Assistant

## 2018-07-21 DIAGNOSIS — Z3141 Encounter for fertility testing: Secondary | ICD-10-CM | POA: Diagnosis not present

## 2018-07-21 NOTE — Telephone Encounter (Signed)
Cynthia Christensen, can you please look into this? Thanks

## 2018-07-21 NOTE — Telephone Encounter (Signed)
Ok I place order for home sleep test.

## 2018-07-21 NOTE — Telephone Encounter (Signed)
See msg. Just placed home sleep study order.

## 2018-07-24 DIAGNOSIS — Z3141 Encounter for fertility testing: Secondary | ICD-10-CM | POA: Diagnosis not present

## 2018-07-26 NOTE — Telephone Encounter (Signed)
I spoke with Jenny Reichmann- Referral coordinator and she is checking on this now and states she will update the patient

## 2018-07-26 NOTE — Telephone Encounter (Signed)
Can we just make sure they got home sleep test referral and update patient on where she is in the process.

## 2018-07-26 NOTE — Telephone Encounter (Signed)
Sleep studies take a little longer to schedule due to they have to call and get authorization from insurance company before they can call patient. - CF

## 2018-07-26 NOTE — Telephone Encounter (Signed)
I called the sleep Center they stated the in lab study was denied and the new order for the Home test was not put in until the 26th. They are working on [employer] and will be calling patient to schedule her. I called patient and updated her with all the information. - CF

## 2018-08-02 DIAGNOSIS — Z3141 Encounter for fertility testing: Secondary | ICD-10-CM | POA: Diagnosis not present

## 2018-08-11 DIAGNOSIS — Z3141 Encounter for fertility testing: Secondary | ICD-10-CM | POA: Diagnosis not present

## 2018-08-11 IMAGING — US US PELVIS COMPLETE
1 series · 14 of 25 positions shown · non-contrast
Comparison: None

CLINICAL DATA: Right pelvic pain for several days, initial
encounter



[Series 1: us pelvis complete · 0.20mm/px · 14 of 85 slices shown]
[im 1/85]
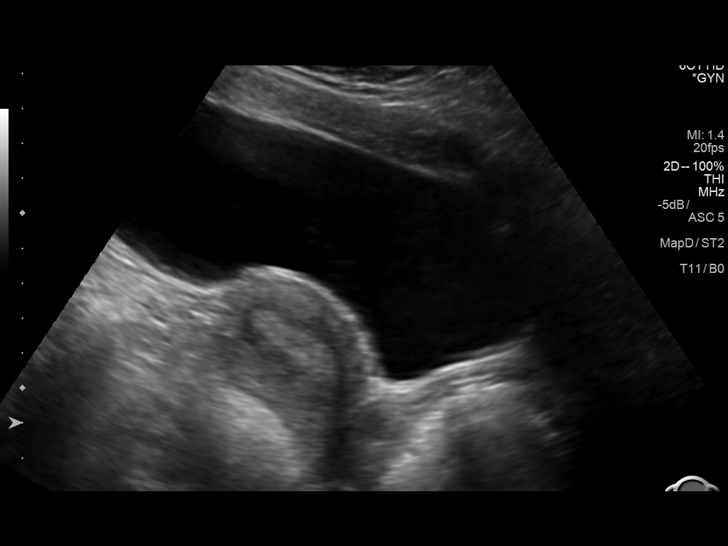
[im 8/85]
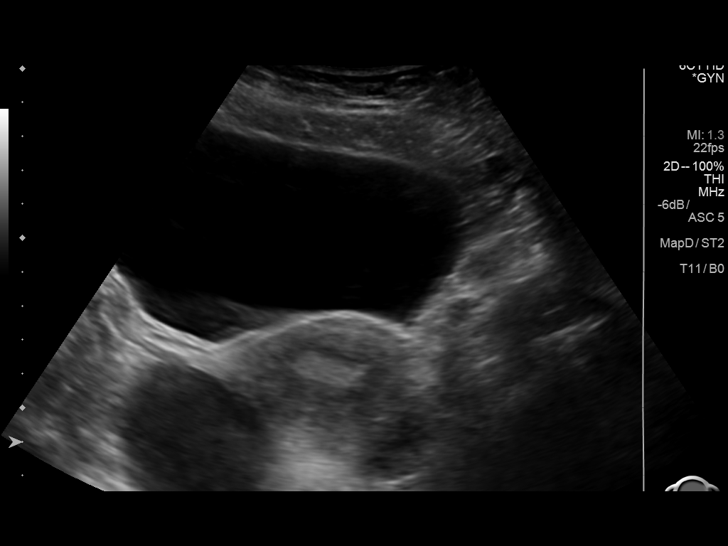
[im 15/85]
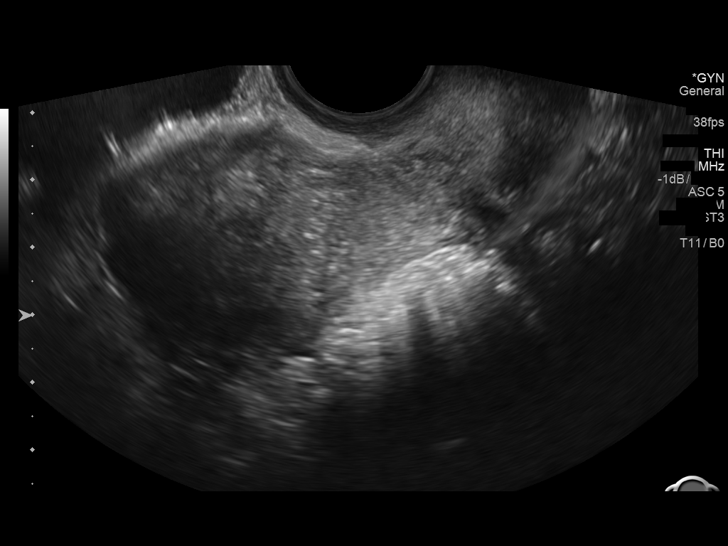
[im 22/85]
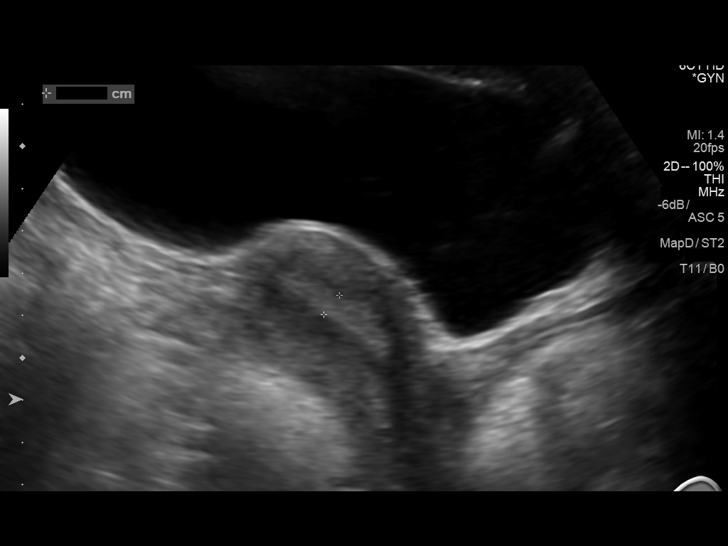
[im 29/85]
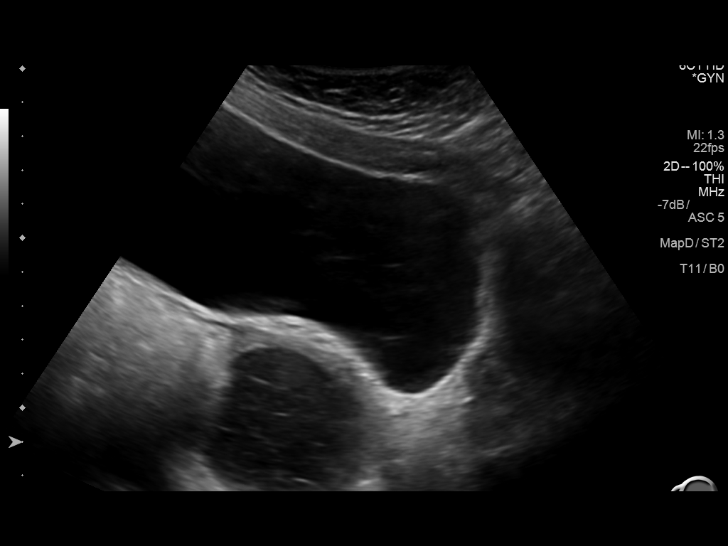
[im 32/85]
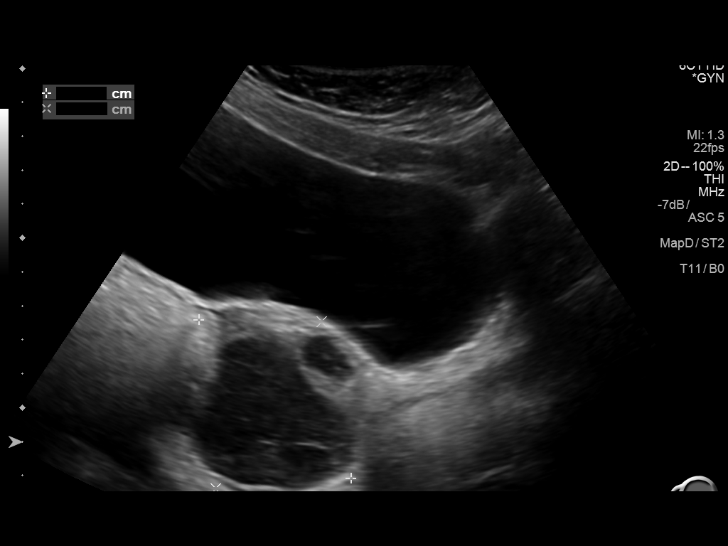
[im 39/85]
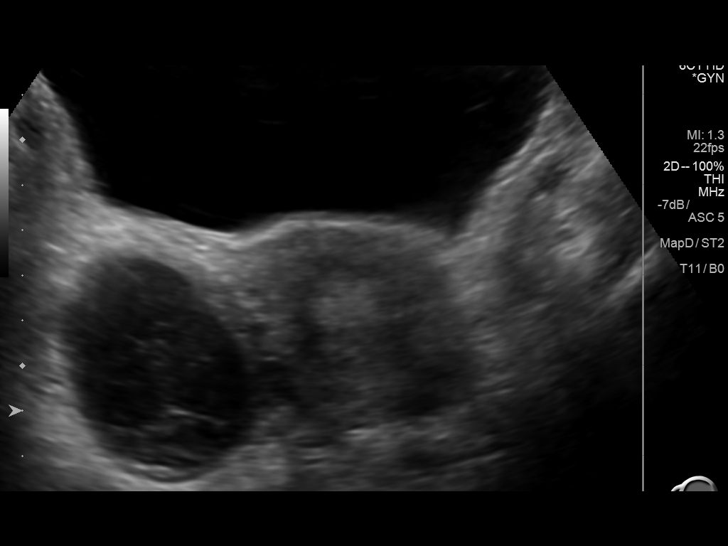
[im 46/85]
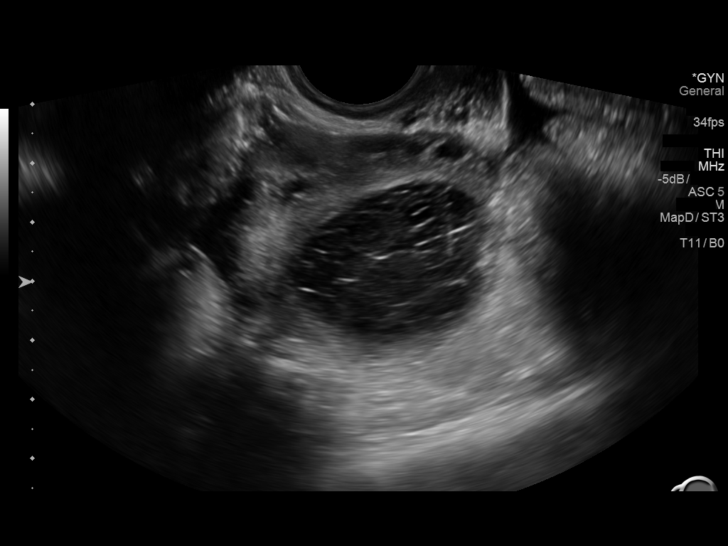
[im 53/85]
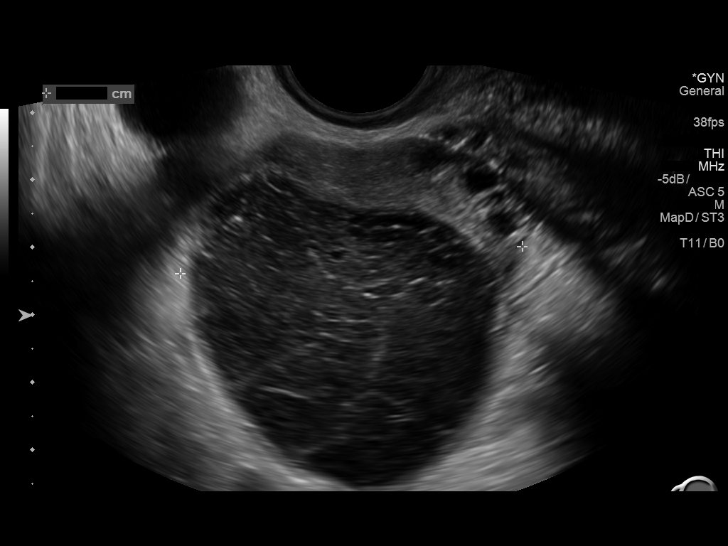
[im 57/85]
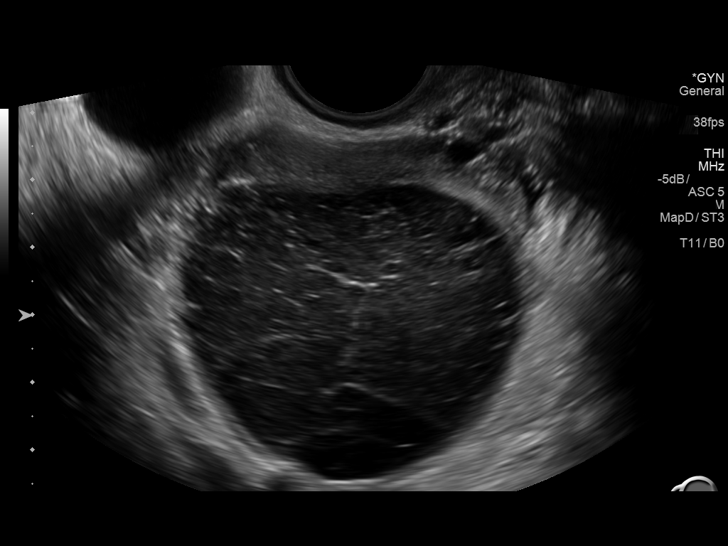
[im 64/85]
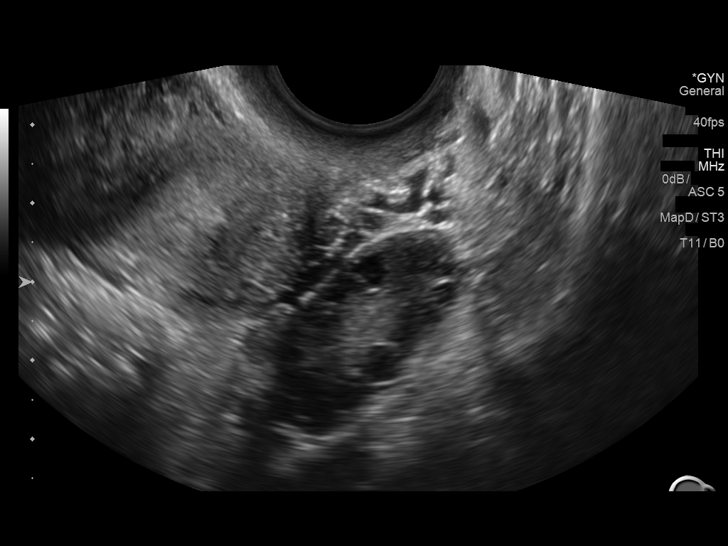
[im 71/85]
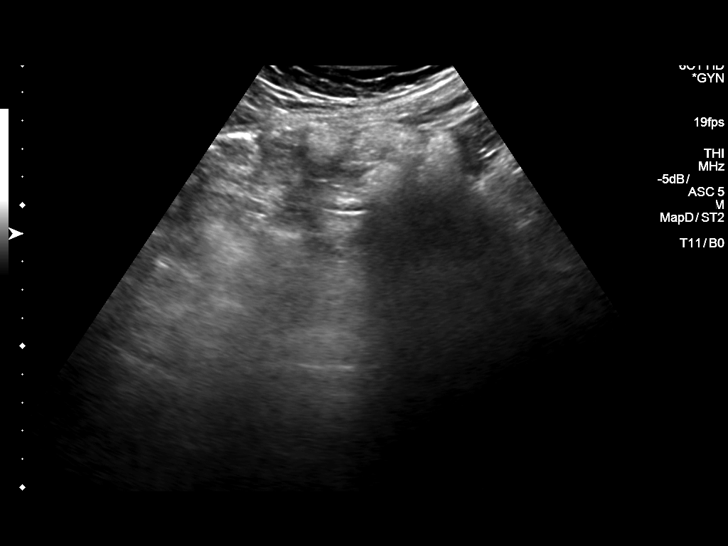
[im 78/85]
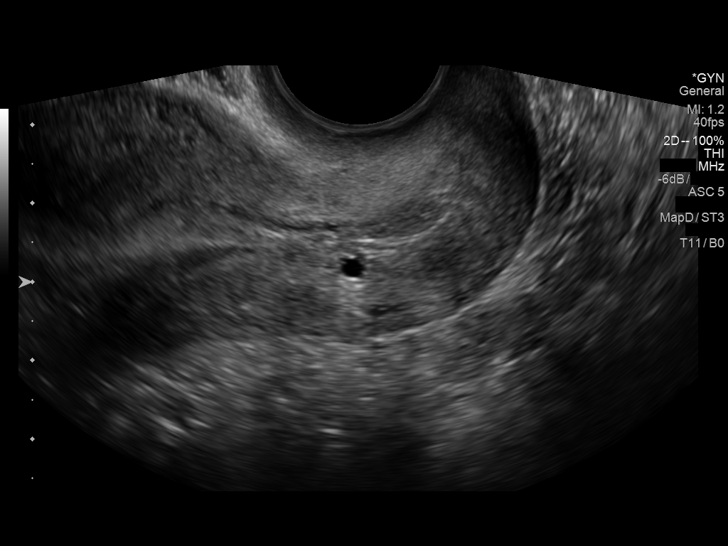
[im 85/85]
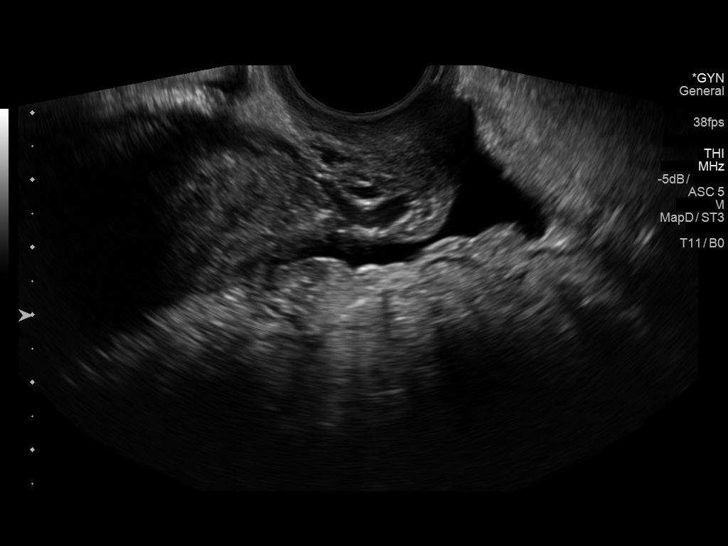

[14 of 25 positions shown; findings below may reference images not displayed]

FINDINGS: Uterus

Measurements: 8.0 x 4.0 x 4.9 cm.. No fibroids or other mass
visualized. Nabothian cyst is seen.

Endometrium

Thickness: 6.3 mm..  No focal abnormality visualized.

Right ovary

Measurements: 5.4 x 5.8 x 5.1 cm.. A complex cystic lesion is noted
within the right ovary measuring 5.0 x 4.7 x 4.9 cm. This likely
represents a hemorrhagic cyst.

Left ovary

Measurements: 2.5 x 1.9 x 1.9 cm. Diffuse follicular changes are
noted..

Other findings

Mild free pelvic fluid is noted.
IMPRESSION: Complex cystic lesion within the right ovary as described. This
likely represents a hemorrhagic cyst. Followup examination is
recommended to assess for resolution.

## 2018-08-30 DIAGNOSIS — Z3141 Encounter for fertility testing: Secondary | ICD-10-CM | POA: Diagnosis not present

## 2018-08-30 DIAGNOSIS — Z3183 Encounter for assisted reproductive fertility procedure cycle: Secondary | ICD-10-CM | POA: Diagnosis not present

## 2018-09-11 ENCOUNTER — Ambulatory Visit (HOSPITAL_BASED_OUTPATIENT_CLINIC_OR_DEPARTMENT_OTHER): Payer: BC Managed Care – PPO | Attending: Physician Assistant | Admitting: Internal Medicine

## 2018-09-11 ENCOUNTER — Encounter

## 2018-09-11 ENCOUNTER — Other Ambulatory Visit: Payer: Self-pay

## 2018-09-11 VITALS — Ht 62.0 in | Wt 179.0 lb

## 2018-09-11 DIAGNOSIS — E6609 Other obesity due to excess calories: Secondary | ICD-10-CM | POA: Insufficient documentation

## 2018-09-11 DIAGNOSIS — R0681 Apnea, not elsewhere classified: Secondary | ICD-10-CM

## 2018-09-11 DIAGNOSIS — G478 Other sleep disorders: Secondary | ICD-10-CM | POA: Diagnosis not present

## 2018-09-11 DIAGNOSIS — Z6832 Body mass index (BMI) 32.0-32.9, adult: Secondary | ICD-10-CM | POA: Insufficient documentation

## 2018-09-11 DIAGNOSIS — R0683 Snoring: Secondary | ICD-10-CM | POA: Diagnosis not present

## 2018-09-15 DIAGNOSIS — Z3200 Encounter for pregnancy test, result unknown: Secondary | ICD-10-CM | POA: Diagnosis not present

## 2018-09-16 DIAGNOSIS — R0683 Snoring: Secondary | ICD-10-CM

## 2018-09-16 DIAGNOSIS — E6609 Other obesity due to excess calories: Secondary | ICD-10-CM

## 2018-09-16 DIAGNOSIS — R0681 Apnea, not elsewhere classified: Secondary | ICD-10-CM

## 2018-09-16 DIAGNOSIS — Z6832 Body mass index (BMI) 32.0-32.9, adult: Secondary | ICD-10-CM

## 2018-09-16 DIAGNOSIS — G478 Other sleep disorders: Secondary | ICD-10-CM

## 2018-09-16 NOTE — Procedures (Signed)
     Patient Name: Cynthia Christensen, Cynthia Christensen Date: 09/11/2018 Gender: Female D.O.B: January 19, 1986 Age (years): 33 Referring Provider: Iran Planas Height (inches): 62 Interpreting Physician: Baird Lyons MD, ABSM Weight (lbs): 179 RPSGT: Jacolyn Reedy BMI: 33 MRN: [id number] Neck Size: 14.50  CLINICAL INFORMATION Sleep Study Type: HST Indication for sleep study: Snoring (786.09) Epworth Sleepiness Score: 7  SLEEP STUDY TECHNIQUE A multi-channel overnight portable sleep study was performed. The channels recorded were: nasal airflow, thoracic respiratory movement, and oxygen saturation with a pulse oximetry. Snoring was also monitored.  MEDICATIONS Patient self administered medications include: none reported.  SLEEP ARCHITECTURE Patient was studied for 432.4 minutes. The sleep efficiency was 100.0 % and the patient was supine for 91.2%. The arousal index was 0.0 per hour.  RESPIRATORY PARAMETERS The overall AHI was 3.1 per hour, with a central apnea index of 0.0 per hour. The oxygen nadir was 93% during sleep.  CARDIAC DATA Mean heart rate during sleep was 57.3 bpm.  IMPRESSIONS - No significant obstructive sleep apnea occurred during this study (AHI = 3.1/h). - No significant central sleep apnea occurred during this study (CAI = 0.0/h). - The patient had minimal or no oxygen desaturation during the study (Min O2 = 93%) - Patient snored.  DIAGNOSIS - Normal study  RECOMMENDATIONS - Manage for symptoms and snoring based onclinical judgment. - Be careful with alcohol, sedatives and other CNS depressants that may worsen sleep apnea and disrupt normal sleep architecture. - Sleep hygiene should be reviewed to assess factors that may improve sleep quality. - Weight management and regular exercise should be initiated or continued.  [Electronically signed] 09/16/2018 10:46 AM  Baird Lyons MD, ABSM Diplomate, American Board of Sleep Medicine   NPI: [id number]                         [place] [place] [facility]  ELECTRONICALLY SIGNED ON:  09/16/2018, 10:45 AM [place] PH: [phone]   FX: [phone] [place]

## 2018-09-18 NOTE — Telephone Encounter (Signed)
GREAT news. Normal sleep study. Snoring reported. Normal oxygen saturation. No central apena events and no significant obstructive apnea events. For snoring could try the breath right strips.

## 2018-09-19 DIAGNOSIS — Z3200 Encounter for pregnancy test, result unknown: Secondary | ICD-10-CM | POA: Diagnosis not present

## 2018-09-28 DIAGNOSIS — Z3200 Encounter for pregnancy test, result unknown: Secondary | ICD-10-CM | POA: Diagnosis not present

## 2018-10-04 NOTE — Progress Notes (Signed)
I just got sleep study results. Normal study.

## 2018-10-05 ENCOUNTER — Telehealth: Payer: Self-pay | Admitting: Neurology

## 2018-10-05 NOTE — Telephone Encounter (Signed)
-----   Message from Donella Stade, [place] sent at 10/04/2018  5:30 PM EDT -----   ----- Message ----- From: Deneise Lever, MD Sent: 09/16/2018  10:49 AM EDT To: Donella Stade, PA-C

## 2018-10-05 NOTE — Telephone Encounter (Signed)
Author: Donella Stade, PA-C Service: - Author Type: Physician Assistant  Filed: 10/04/2018 5:30 PM Encounter Date: 09/11/2018 Status: Signed  Editor: Lavada Mesi (Physician Assistant)      Added by: [x] Breeback, Jade L, PA-C  I just got sleep study results. Normal study.      Left message on machine for patient to call back to inform her of normal sleep study.

## 2018-10-06 DIAGNOSIS — Z3200 Encounter for pregnancy test, result unknown: Secondary | ICD-10-CM | POA: Diagnosis not present

## 2018-10-06 DIAGNOSIS — Z3490 Encounter for supervision of normal pregnancy, unspecified, unspecified trimester: Secondary | ICD-10-CM | POA: Diagnosis not present

## 2018-10-13 DIAGNOSIS — Z3200 Encounter for pregnancy test, result unknown: Secondary | ICD-10-CM | POA: Diagnosis not present

## 2018-10-13 DIAGNOSIS — Z3490 Encounter for supervision of normal pregnancy, unspecified, unspecified trimester: Secondary | ICD-10-CM | POA: Diagnosis not present

## 2018-10-18 DIAGNOSIS — Z3481 Encounter for supervision of other normal pregnancy, first trimester: Secondary | ICD-10-CM | POA: Diagnosis not present

## 2018-10-19 DIAGNOSIS — O09291 Supervision of pregnancy with other poor reproductive or obstetric history, first trimester: Secondary | ICD-10-CM | POA: Diagnosis not present

## 2018-10-19 DIAGNOSIS — Z77011 Contact with and (suspected) exposure to lead: Secondary | ICD-10-CM | POA: Diagnosis not present

## 2018-10-30 DIAGNOSIS — Z01419 Encounter for gynecological examination (general) (routine) without abnormal findings: Secondary | ICD-10-CM | POA: Diagnosis not present

## 2018-10-30 DIAGNOSIS — B354 Tinea corporis: Secondary | ICD-10-CM | POA: Diagnosis not present

## 2018-10-30 DIAGNOSIS — O09291 Supervision of pregnancy with other poor reproductive or obstetric history, first trimester: Secondary | ICD-10-CM | POA: Diagnosis not present

## 2018-10-30 DIAGNOSIS — Z1151 Encounter for screening for human papillomavirus (HPV): Secondary | ICD-10-CM | POA: Diagnosis not present

## 2018-10-30 DIAGNOSIS — Z3481 Encounter for supervision of other normal pregnancy, first trimester: Secondary | ICD-10-CM | POA: Diagnosis not present

## 2018-10-30 DIAGNOSIS — Z3A12 12 weeks gestation of pregnancy: Secondary | ICD-10-CM | POA: Diagnosis not present

## 2018-10-30 DIAGNOSIS — Z113 Encounter for screening for infections with a predominantly sexual mode of transmission: Secondary | ICD-10-CM | POA: Diagnosis not present

## 2018-11-16 DIAGNOSIS — Z3A12 12 weeks gestation of pregnancy: Secondary | ICD-10-CM | POA: Diagnosis not present

## 2018-11-16 DIAGNOSIS — Z3481 Encounter for supervision of other normal pregnancy, first trimester: Secondary | ICD-10-CM | POA: Diagnosis not present

## 2018-11-16 DIAGNOSIS — B354 Tinea corporis: Secondary | ICD-10-CM | POA: Diagnosis not present

## 2018-11-16 DIAGNOSIS — Z36 Encounter for antenatal screening for chromosomal anomalies: Secondary | ICD-10-CM | POA: Diagnosis not present

## 2018-11-16 DIAGNOSIS — Z3682 Encounter for antenatal screening for nuchal translucency: Secondary | ICD-10-CM | POA: Diagnosis not present

## 2018-11-16 DIAGNOSIS — Z3200 Encounter for pregnancy test, result unknown: Secondary | ICD-10-CM | POA: Diagnosis not present

## 2018-11-17 ENCOUNTER — Encounter: Payer: Self-pay | Admitting: Physician Assistant

## 2018-12-04 DIAGNOSIS — Z8759 Personal history of other complications of pregnancy, childbirth and the puerperium: Secondary | ICD-10-CM | POA: Diagnosis not present

## 2018-12-04 DIAGNOSIS — Z113 Encounter for screening for infections with a predominantly sexual mode of transmission: Secondary | ICD-10-CM | POA: Diagnosis not present

## 2018-12-04 DIAGNOSIS — Z3480 Encounter for supervision of other normal pregnancy, unspecified trimester: Secondary | ICD-10-CM | POA: Diagnosis not present

## 2018-12-04 DIAGNOSIS — Z9289 Personal history of other medical treatment: Secondary | ICD-10-CM | POA: Diagnosis not present

## 2018-12-11 DIAGNOSIS — Z3480 Encounter for supervision of other normal pregnancy, unspecified trimester: Secondary | ICD-10-CM | POA: Diagnosis not present

## 2018-12-11 DIAGNOSIS — Z369 Encounter for antenatal screening, unspecified: Secondary | ICD-10-CM | POA: Diagnosis not present

## 2018-12-11 DIAGNOSIS — O99210 Obesity complicating pregnancy, unspecified trimester: Secondary | ICD-10-CM | POA: Diagnosis not present

## 2018-12-26 DIAGNOSIS — Z8759 Personal history of other complications of pregnancy, childbirth and the puerperium: Secondary | ICD-10-CM | POA: Diagnosis not present

## 2018-12-26 DIAGNOSIS — R7989 Other specified abnormal findings of blood chemistry: Secondary | ICD-10-CM | POA: Diagnosis not present

## 2019-01-08 DIAGNOSIS — O09522 Supervision of elderly multigravida, second trimester: Secondary | ICD-10-CM | POA: Diagnosis not present

## 2019-01-08 DIAGNOSIS — Z3689 Encounter for other specified antenatal screening: Secondary | ICD-10-CM | POA: Diagnosis not present

## 2019-01-24 DIAGNOSIS — R7989 Other specified abnormal findings of blood chemistry: Secondary | ICD-10-CM | POA: Diagnosis not present

## 2019-02-01 DIAGNOSIS — O358XX0 Maternal care for other (suspected) fetal abnormality and damage, not applicable or unspecified: Secondary | ICD-10-CM | POA: Diagnosis not present

## 2019-02-05 DIAGNOSIS — Z369 Encounter for antenatal screening, unspecified: Secondary | ICD-10-CM | POA: Diagnosis not present

## 2019-02-05 DIAGNOSIS — Z113 Encounter for screening for infections with a predominantly sexual mode of transmission: Secondary | ICD-10-CM | POA: Diagnosis not present

## 2019-02-14 DIAGNOSIS — R7309 Other abnormal glucose: Secondary | ICD-10-CM | POA: Diagnosis not present

## 2019-03-05 DIAGNOSIS — Z23 Encounter for immunization: Secondary | ICD-10-CM | POA: Diagnosis not present

## 2019-03-05 DIAGNOSIS — Z3A28 28 weeks gestation of pregnancy: Secondary | ICD-10-CM | POA: Diagnosis not present

## 2019-03-14 DIAGNOSIS — R7989 Other specified abnormal findings of blood chemistry: Secondary | ICD-10-CM | POA: Diagnosis not present

## 2019-03-26 DIAGNOSIS — O09813 Supervision of pregnancy resulting from assisted reproductive technology, third trimester: Secondary | ICD-10-CM | POA: Diagnosis not present

## 2019-03-28 DIAGNOSIS — R7989 Other specified abnormal findings of blood chemistry: Secondary | ICD-10-CM | POA: Diagnosis not present

## 2019-03-29 DIAGNOSIS — R7309 Other abnormal glucose: Secondary | ICD-10-CM | POA: Diagnosis not present

## 2019-04-05 DIAGNOSIS — Z3A32 32 weeks gestation of pregnancy: Secondary | ICD-10-CM | POA: Diagnosis not present

## 2019-04-05 DIAGNOSIS — O24414 Gestational diabetes mellitus in pregnancy, insulin controlled: Secondary | ICD-10-CM | POA: Diagnosis not present

## 2019-04-09 DIAGNOSIS — Z3A33 33 weeks gestation of pregnancy: Secondary | ICD-10-CM | POA: Diagnosis not present

## 2019-04-09 DIAGNOSIS — O24414 Gestational diabetes mellitus in pregnancy, insulin controlled: Secondary | ICD-10-CM | POA: Diagnosis not present

## 2019-04-12 DIAGNOSIS — O24414 Gestational diabetes mellitus in pregnancy, insulin controlled: Secondary | ICD-10-CM | POA: Diagnosis not present

## 2019-04-16 DIAGNOSIS — Z3A30 30 weeks gestation of pregnancy: Secondary | ICD-10-CM | POA: Diagnosis not present

## 2019-04-16 DIAGNOSIS — O24414 Gestational diabetes mellitus in pregnancy, insulin controlled: Secondary | ICD-10-CM | POA: Diagnosis not present

## 2019-04-19 DIAGNOSIS — O24414 Gestational diabetes mellitus in pregnancy, insulin controlled: Secondary | ICD-10-CM | POA: Diagnosis not present

## 2019-04-20 ENCOUNTER — Other Ambulatory Visit: Payer: Self-pay

## 2019-04-20 ENCOUNTER — Encounter: Payer: Self-pay | Admitting: Physician Assistant

## 2019-04-20 ENCOUNTER — Ambulatory Visit (INDEPENDENT_AMBULATORY_CARE_PROVIDER_SITE_OTHER): Payer: BC Managed Care – PPO | Admitting: Physician Assistant

## 2019-04-20 VITALS — BP 96/77 | HR 100 | Ht 62.0 in | Wt 227.0 lb

## 2019-04-20 DIAGNOSIS — M542 Cervicalgia: Secondary | ICD-10-CM

## 2019-04-20 NOTE — Progress Notes (Signed)
Subjective:    Patient ID: Cynthia Christensen, female    DOB: 1985/05/10, 34 y.o.   MRN: [id number]  HPI  Pt is a 34 yo female in her third trimester who presents to the clinic with right neck pain that started 3-4 days ago. Does seem better today. No ST, fever, chills, problems swallowing, ear pain or sinus pressure. Not done anything to make better. She does have gestational diabetes and on ASA daily. She denies any cough or SOB or chest pain. No concerning swelling.   .. Active Ambulatory Problems    Diagnosis Date Noted  . Candidal skin infection 05/03/2012  . Tinea versicolor 05/03/2012  . Tobacco use 05/03/2012  . Hirsutism 05/22/2012  . Hyperlipidemia 05/22/2012  . Personal history of other specified conditions 08/07/2012  . Acne inversa 08/07/2012  . 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 Lip swelling 09/19/2015  . Tongue swelling 09/19/2015  . Allergic reaction to food 09/19/2015  . Status post cholecystectomy 07/12/2018  . Witnessed episode of apnea 07/12/2018  . Class 1 obesity due to excess calories without serious comorbidity with body mass index (BMI) of 32.0 to 32.9 in adult 07/12/2018  . 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 Snoring 07/12/2018   Resolved Ambulatory Problems    Diagnosis Date Noted  . No Resolved Ambulatory Problems   Past Medical History:  Diagnosis Date  . History of HPV infection           Review of Systems See HPI.     Objective:   Physical Exam Vitals reviewed.  Constitutional:      Appearance: Normal appearance.  HENT:     Head: Normocephalic.     Right Ear: Tympanic membrane, ear canal and external ear normal.     Left Ear: Tympanic membrane, ear canal and external ear normal.     Nose: Nose normal.     Comments: No tenderness over jaw/cheek/salivary gland.     Mouth/Throat:     Mouth: Mucous membranes are moist.     Pharynx: Oropharynx is clear. No oropharyngeal exudate or posterior oropharyngeal erythema.     Comments: No tonsilar  swelling or exudate.  Eyes:     Extraocular Movements: Extraocular movements intact.     Conjunctiva/sclera: Conjunctivae normal.     Pupils: Pupils are equal, round, and reactive to light.  Neck:     Comments: No swollen lymphnode palpated. Right sided neck pain is over a normal size lymph node.  Cardiovascular:     Rate and Rhythm: Normal rate and regular rhythm.  Pulmonary:     Effort: Pulmonary effort is normal.     Breath sounds: Normal breath sounds.  Abdominal:     General: Bowel sounds are normal.     Palpations: Abdomen is soft.  Musculoskeletal:     Cervical back: Normal range of motion and neck supple.     Right lower leg: No edema.     Left lower leg: No edema.  Lymphadenopathy:     Cervical: No cervical adenopathy.  Neurological:     General: No focal deficit present.     Mental Status: She is alert.  Psychiatric:        Mood and Affect: Mood normal.           Assessment & Plan:  Marland KitchenMarland KitchenDiagnoses and all orders for this visit:  Neck pain on right side   No concerning signs today on physical exam or discussion. Pain seems to be coming over a lymph node but not  abnormally enlarged or tender.  Likely allergies. Can use flonase, lemon water, tylenol as needed. Discussed signs and symptoms of infection and to call office with any. BP a little low today. Make sure staying hydrated. Reassured no signs of swelling or exam today. Continue weekly follow ups with OB.

## 2019-04-23 ENCOUNTER — Encounter: Payer: Self-pay | Admitting: Physician Assistant

## 2019-04-23 DIAGNOSIS — O24414 Gestational diabetes mellitus in pregnancy, insulin controlled: Secondary | ICD-10-CM | POA: Diagnosis not present

## 2019-04-23 DIAGNOSIS — O99213 Obesity complicating pregnancy, third trimester: Secondary | ICD-10-CM | POA: Diagnosis not present

## 2019-04-25 DIAGNOSIS — O24414 Gestational diabetes mellitus in pregnancy, insulin controlled: Secondary | ICD-10-CM | POA: Diagnosis not present

## 2019-04-25 DIAGNOSIS — Z3A35 35 weeks gestation of pregnancy: Secondary | ICD-10-CM | POA: Diagnosis not present

## 2019-05-02 DIAGNOSIS — Z3A36 36 weeks gestation of pregnancy: Secondary | ICD-10-CM | POA: Diagnosis not present

## 2019-05-02 DIAGNOSIS — O09523 Supervision of elderly multigravida, third trimester: Secondary | ICD-10-CM | POA: Diagnosis not present

## 2019-05-02 DIAGNOSIS — O24414 Gestational diabetes mellitus in pregnancy, insulin controlled: Secondary | ICD-10-CM | POA: Diagnosis not present

## 2019-05-02 DIAGNOSIS — O99213 Obesity complicating pregnancy, third trimester: Secondary | ICD-10-CM | POA: Diagnosis not present

## 2019-05-02 DIAGNOSIS — O3663X0 Maternal care for excessive fetal growth, third trimester, not applicable or unspecified: Secondary | ICD-10-CM | POA: Diagnosis not present

## 2019-05-04 ENCOUNTER — Encounter: Payer: Self-pay | Admitting: Physician Assistant

## 2019-05-07 DIAGNOSIS — O24414 Gestational diabetes mellitus in pregnancy, insulin controlled: Secondary | ICD-10-CM | POA: Diagnosis not present

## 2019-05-07 DIAGNOSIS — O09523 Supervision of elderly multigravida, third trimester: Secondary | ICD-10-CM | POA: Diagnosis not present

## 2019-05-09 DIAGNOSIS — O99210 Obesity complicating pregnancy, unspecified trimester: Secondary | ICD-10-CM | POA: Diagnosis not present

## 2019-05-09 DIAGNOSIS — O24414 Gestational diabetes mellitus in pregnancy, insulin controlled: Secondary | ICD-10-CM | POA: Diagnosis not present

## 2019-05-14 DIAGNOSIS — O368130 Decreased fetal movements, third trimester, not applicable or unspecified: Secondary | ICD-10-CM | POA: Diagnosis not present

## 2019-05-14 DIAGNOSIS — Z01812 Encounter for preprocedural laboratory examination: Secondary | ICD-10-CM | POA: Diagnosis not present

## 2019-05-14 DIAGNOSIS — Z20822 Contact with and (suspected) exposure to covid-19: Secondary | ICD-10-CM | POA: Diagnosis not present

## 2019-05-16 DIAGNOSIS — O24414 Gestational diabetes mellitus in pregnancy, insulin controlled: Secondary | ICD-10-CM | POA: Diagnosis not present

## 2019-05-16 DIAGNOSIS — O0993 Supervision of high risk pregnancy, unspecified, third trimester: Secondary | ICD-10-CM | POA: Diagnosis not present

## 2019-05-16 DIAGNOSIS — O3663X0 Maternal care for excessive fetal growth, third trimester, not applicable or unspecified: Secondary | ICD-10-CM | POA: Diagnosis not present

## 2019-05-16 DIAGNOSIS — O99213 Obesity complicating pregnancy, third trimester: Secondary | ICD-10-CM | POA: Diagnosis not present

## 2019-05-20 DIAGNOSIS — O99824 Streptococcus B carrier state complicating childbirth: Secondary | ICD-10-CM | POA: Diagnosis not present

## 2019-05-20 DIAGNOSIS — O24424 Gestational diabetes mellitus in childbirth, insulin controlled: Secondary | ICD-10-CM | POA: Diagnosis not present

## 2019-05-20 DIAGNOSIS — Z87891 Personal history of nicotine dependence: Secondary | ICD-10-CM | POA: Diagnosis not present

## 2019-05-20 DIAGNOSIS — Z79899 Other long term (current) drug therapy: Secondary | ICD-10-CM | POA: Diagnosis not present

## 2019-05-20 DIAGNOSIS — E785 Hyperlipidemia, unspecified: Secondary | ICD-10-CM | POA: Diagnosis not present

## 2019-05-20 DIAGNOSIS — O99214 Obesity complicating childbirth: Secondary | ICD-10-CM | POA: Diagnosis not present

## 2019-05-20 DIAGNOSIS — Z3A39 39 weeks gestation of pregnancy: Secondary | ICD-10-CM | POA: Diagnosis not present

## 2019-05-20 DIAGNOSIS — O9081 Anemia of the puerperium: Secondary | ICD-10-CM | POA: Diagnosis not present

## 2019-05-20 DIAGNOSIS — O3663X0 Maternal care for excessive fetal growth, third trimester, not applicable or unspecified: Secondary | ICD-10-CM | POA: Diagnosis not present

## 2019-05-20 DIAGNOSIS — O324XX0 Maternal care for high head at term, not applicable or unspecified: Secondary | ICD-10-CM | POA: Diagnosis not present

## 2019-05-20 DIAGNOSIS — O403XX0 Polyhydramnios, third trimester, not applicable or unspecified: Secondary | ICD-10-CM | POA: Diagnosis not present

## 2019-05-20 DIAGNOSIS — D62 Acute posthemorrhagic anemia: Secondary | ICD-10-CM | POA: Diagnosis not present

## 2019-05-22 DIAGNOSIS — Z3A39 39 weeks gestation of pregnancy: Secondary | ICD-10-CM | POA: Diagnosis not present

## 2019-05-22 DIAGNOSIS — O99824 Streptococcus B carrier state complicating childbirth: Secondary | ICD-10-CM | POA: Diagnosis not present

## 2019-05-23 DIAGNOSIS — O99824 Streptococcus B carrier state complicating childbirth: Secondary | ICD-10-CM | POA: Diagnosis not present

## 2019-05-23 DIAGNOSIS — Z3A39 39 weeks gestation of pregnancy: Secondary | ICD-10-CM | POA: Diagnosis not present

## 2019-05-26 DIAGNOSIS — K644 Residual hemorrhoidal skin tags: Secondary | ICD-10-CM | POA: Diagnosis not present

## 2019-05-26 DIAGNOSIS — O9963 Diseases of the digestive system complicating the puerperium: Secondary | ICD-10-CM | POA: Diagnosis not present

## 2019-05-26 DIAGNOSIS — O9089 Other complications of the puerperium, not elsewhere classified: Secondary | ICD-10-CM | POA: Diagnosis not present

## 2019-05-26 DIAGNOSIS — K649 Unspecified hemorrhoids: Secondary | ICD-10-CM | POA: Diagnosis not present

## 2019-05-26 DIAGNOSIS — Z79899 Other long term (current) drug therapy: Secondary | ICD-10-CM | POA: Diagnosis not present

## 2019-05-26 DIAGNOSIS — K6289 Other specified diseases of anus and rectum: Secondary | ICD-10-CM | POA: Diagnosis not present

## 2019-05-26 DIAGNOSIS — Z87891 Personal history of nicotine dependence: Secondary | ICD-10-CM | POA: Diagnosis not present

## 2019-05-26 DIAGNOSIS — K5900 Constipation, unspecified: Secondary | ICD-10-CM | POA: Diagnosis not present

## 2019-05-28 DIAGNOSIS — O1205 Gestational edema, complicating the puerperium: Secondary | ICD-10-CM | POA: Diagnosis not present

## 2019-06-27 DIAGNOSIS — B3789 Other sites of candidiasis: Secondary | ICD-10-CM | POA: Diagnosis not present

## 2019-07-03 DIAGNOSIS — Z8632 Personal history of gestational diabetes: Secondary | ICD-10-CM | POA: Diagnosis not present

## 2019-07-03 DIAGNOSIS — Z8759 Personal history of other complications of pregnancy, childbirth and the puerperium: Secondary | ICD-10-CM | POA: Diagnosis not present

## 2019-07-03 DIAGNOSIS — O9081 Anemia of the puerperium: Secondary | ICD-10-CM | POA: Diagnosis not present

## 2019-07-03 DIAGNOSIS — R6889 Other general symptoms and signs: Secondary | ICD-10-CM | POA: Diagnosis not present

## 2019-07-03 DIAGNOSIS — Z1331 Encounter for screening for depression: Secondary | ICD-10-CM | POA: Diagnosis not present

## 2019-07-05 ENCOUNTER — Encounter: Payer: Self-pay | Admitting: Physician Assistant

## 2019-07-25 ENCOUNTER — Ambulatory Visit (INDEPENDENT_AMBULATORY_CARE_PROVIDER_SITE_OTHER): Payer: BC Managed Care – PPO | Admitting: Physician Assistant

## 2019-07-25 DIAGNOSIS — Z5329 Procedure and treatment not carried out because of patient's decision for other reasons: Secondary | ICD-10-CM

## 2019-07-26 ENCOUNTER — Ambulatory Visit (INDEPENDENT_AMBULATORY_CARE_PROVIDER_SITE_OTHER): Payer: BC Managed Care – PPO | Admitting: Nurse Practitioner

## 2019-07-26 ENCOUNTER — Encounter: Payer: Self-pay | Admitting: Nurse Practitioner

## 2019-07-26 ENCOUNTER — Other Ambulatory Visit: Payer: Self-pay

## 2019-07-26 VITALS — BP 119/83 | HR 69 | Temp 97.5°F | Ht 62.0 in | Wt 191.4 lb

## 2019-07-26 DIAGNOSIS — R222 Localized swelling, mass and lump, trunk: Secondary | ICD-10-CM

## 2019-07-26 NOTE — Patient Instructions (Signed)
I will send the referral to general surgery for evaluation of the hernia. They will likely want to do testing either as a CT scan of the abdomen or ultrasound of the area. It is hard to say completely if this is a fatty herniated area or a herniation of the intestines. Please let us know if this gets any larger or more painful or if you begin to experience any of the warning signs listed below.    Hernia, Adult     A hernia is the bulging of an organ or tissue through a weak spot in the muscles of the abdomen (abdominal wall). Hernias develop most often near the belly button (navel) or the area where the leg meets the lower abdomen (groin). Common types of hernias include:  Incisional hernia. This type bulges through a scar from an abdominal surgery.  Umbilical hernia. This type develops near the navel.  Inguinal hernia. This type develops in the groin or scrotum.  Femoral hernia. This type develops under the groin, in the upper thigh area.  Hiatal hernia. This type occurs when part of the stomach slides above the muscle that separates the abdomen from the chest (diaphragm). What are the causes? This condition may be caused by:  Heavy lifting.  Coughing over a long period of time.  Straining to have a bowel movement. Constipation can lead to straining.  An incision made during an abdominal surgery.  A physical problem that is present at birth (congenital defect).  Being overweight or obese.  Smoking.  Excess fluid in the abdomen.  Undescended testicles in males. What are the signs or symptoms? The main symptom is a skin-colored, rounded bulge in the area of the hernia. However, a bulge may not always be present. It may grow bigger or be more visible when you cough or strain (such as when lifting something heavy). A hernia that can be pushed back into the area (is reducible) rarely causes pain. A hernia that cannot be pushed back into the area (is incarcerated) may lose its  blood supply (become strangulated). A hernia that is incarcerated may cause:  Pain.  Fever.  Nausea and vomiting.  Swelling.  Constipation. How is this diagnosed? A hernia may be diagnosed based on:  Your symptoms and medical history.  A physical exam. Your health care provider may ask you to cough or move in certain ways to see if the hernia becomes visible.  Imaging tests, such as: ? X-rays. ? Ultrasound. ? CT scan. How is this treated? A hernia that is small and painless may not need to be treated. A hernia that is large or painful may be treated with surgery. Inguinal hernias may be treated with surgery to prevent incarceration or strangulation. Strangulated hernias are always treated with surgery because a lack of blood supply to the trapped organ or tissue can cause it to die. Surgery to treat a hernia involves pushing the bulge back into place and repairing the weak area of the muscle or abdominal wall. Follow these instructions at home: Activity  Avoid straining.  Do not lift anything that is heavier than 10 lb (4.5 kg), or the limit that you are told, until your health care provider says that it is safe.  When lifting heavy objects, lift with your leg muscles, not your back muscles. Preventing constipation  Take actions to prevent constipation. Constipation leads to straining with bowel movements, which can make a hernia worse or cause a hernia repair to break down. Your health  care provider may recommend that you: ? Drink enough fluid to keep your urine pale yellow. ? Eat foods that are high in fiber, such as fresh fruits and vegetables, whole grains, and beans. ? Limit foods that are high in fat and processed sugars, such as fried or sweet foods. ? Take an over-the-counter or prescription medicine for constipation. General instructions  When coughing, try to cough gently.  You may try to push the hernia back in place by very gently pressing on it while lying  down. Do not try to force the bulge back in if it will not push in easily.  If you are overweight, work with your health care provider to lose weight safely.  Do not use any products that contain nicotine or tobacco, such as cigarettes and e-cigarettes. If you need help quitting, ask your health care provider.  If you are scheduled for hernia repair, watch your hernia for any changes in shape, size, or color. Tell your health care provider about any changes or new symptoms.  Take over-the-counter and prescription medicines only as told by your health care provider.  Keep all follow-up visits as told by your health care provider. This is important. Contact a health care provider if:  You develop new pain, swelling, or redness around your hernia.  You have signs of constipation, such as: ? Fewer bowel movements in a week than normal. ? Difficulty having a bowel movement. ? Stools that are dry, hard, or larger than normal. Get help right away if:  You have a fever.  You have abdomen pain that gets worse.  You feel nauseous or you vomit.  You cannot push the hernia back in place by very gently pressing on it while lying down. Do not try to force the bulge back in if it will not push in easily.  The hernia: ? Changes in shape, size, or color. ? Feels hard or tender. These symptoms may represent a serious problem that is an emergency. Do not wait to see if the symptoms will go away. Get medical help right away. Call your local emergency services (911 in the [place] Summary  A hernia is the bulging of an organ or tissue through a weak spot in the muscles of the abdomen (abdominal wall).  The main symptom is a skin-colored, rounded lump (bulge) in the hernia area. However, a bulge may not always be present. It may grow bigger or more visible when you cough or strain (such as when having a bowel movement).  A hernia that is small and painless may not need to be treated. A hernia that is  large or painful may be treated with surgery.  Surgery to treat a hernia involves pushing the bulge back into place and repairing the weak part of the abdomen. This information is not intended to replace advice given to you by your health care provider. Make sure you discuss any questions you have with your health care provider. Document Revised: 05/04/2018 Document Reviewed: 10/13/2016 Elsevier Patient Education  [place].

## 2019-07-26 NOTE — Progress Notes (Signed)
Established Patient Office Visit  Subjective:  Patient ID: Cynthia Christensen, female    DOB: 1985-03-06  Age: 34 y.o. MRN: [id number]  CC: No chief complaint on file.   HPI Toyoko Silos presents for evaluation of an abdominal wall mass that she first noticed while pregnant with her daughter (now 66 weeks old). She reports that she first noticed a lump near her umbilicus while pregnant and thought that it was the baby. After delivery, the lump was still present, but she was told it was likely the top of her uterus. At her post partum visit she mentioned it again and was told that it may be a fat deposit or hernia. She reports it has been asymptomatic until recently. This week she reports that she started to experience some sharp pain in the area, more after eating, that radiates across the abdomen. She reports the pain is intermittent and does not last for a long period of time. She states that last night after eating she had an episode of nausea. She reports that the area typically protrudes large enough that you can see it through her shirt. It is soft.   HERNIA Duration: months Location: left belly button Painful: yes Discomfort: yes Bulge: yes Quality:  sharp- intermittent  Tender on deep palpation.  Onset: gradual Severity: 7/10 Context: fluctuating Aggravating factors: pressing on the area- larger after eating at times   She denies vomiting, inability to eat, decreased stools, watery stools, hard/dry stools, small volume stools.    Past Medical History:  Diagnosis Date  . History of HPV infection   . Tobacco use 05/03/2012    No past surgical history on file.  Family History  Problem Relation Age of Onset  . Lung cancer Other        grandmother  . Heart attack Other        grandfather  . Thyroid disease Mother     Social History   Socioeconomic History  . Marital status: Married    Spouse name: Not on file  . Number of children: Not on file  . Years of education: Not on  file  . Highest education level: Not on file  Occupational History  . Not on file  Tobacco Use  . Smoking status: Former Smoker    Packs/day: 0.50    Years: 5.00    Pack years: 2.50    Types: Cigarettes    Start date: 08/28/2018  . Smokeless tobacco: Never Used  Vaping Use  . Vaping Use: Unknown  Substance and Sexual Activity  . Alcohol use: Yes  . Drug use: Yes  . Sexual activity: Yes    Birth control/protection: None  Other Topics Concern  . Not on file  Social History Narrative  . Not on file   Social Determinants of Health   Financial Resource Strain:   . Difficulty of Paying Living Expenses:   Food Insecurity:   . Worried About Charity fundraiser in the Last Year:   . Arboriculturist in the Last Year:   Transportation Needs:   . Film/video editor (Medical):   [name] Lack of Transportation (Non-Medical):   Physical Activity:   . Days of Exercise per Week:   . Minutes of Exercise per Session:   Stress:   . Feeling of Stress :   Social Connections:   . Frequency of Communication with Friends and Family:   . Frequency of Social Gatherings with Friends and Family:   . Attends  Religious Services:   . Active Member of Clubs or Organizations:   . Attends Archivist Meetings:   [name] Marital Status:   Intimate Partner Violence:   . Fear of Current or Ex-Partner:   . Emotionally Abused:   [name] Physically Abused:   . Sexually Abused:     Outpatient Medications Prior to Visit  Medication Sig Dispense Refill  . albuterol (PROVENTIL HFA;VENTOLIN HFA) 108 (90 Base) MCG/ACT inhaler Inhale 2 puffs into the lungs every 6 (six) hours as needed for wheezing or shortness of breath. 1 Inhaler 1  . HUMULIN N 100 UNIT/ML injection Inject 23 Units into the skin at bedtime.    . Loratadine 10 MG CAPS Take 10 mg by mouth daily.    . Magnesium 250 MG TABS Take by mouth.    . Nutritional Supplements (VITAMIN D BOOSTER PO) Take 1,800 Units by mouth daily.    . Prenatal Vit-Fe  Fumarate-FA (PRENATAL MULTIVITAMIN) TABS tablet Take 1 tablet by mouth daily at 12 noon.    . progesterone (PROMETRIUM) 200 MG capsule Take 200 mg by mouth daily.    . TURMERIC PO Take by mouth.     No facility-administered medications prior to visit.    No Known Allergies    Objective:    Physical Exam Vitals and nursing note reviewed.  Constitutional:      Appearance: She is well-developed.  HENT:     Head: Normocephalic.  Cardiovascular:     Rate and Rhythm: Normal rate and regular rhythm.     Heart sounds: Normal heart sounds.  Pulmonary:     Effort: Pulmonary effort is normal.     Breath sounds: Normal breath sounds.  Abdominal:     General: Abdomen is protuberant. Bowel sounds are normal.     Palpations: Abdomen is soft. There is mass. There is no shifting dullness, fluid wave, hepatomegaly, splenomegaly or pulsatile mass.     Tenderness: There is abdominal tenderness in the periumbilical area. There is no guarding or rebound.     Hernia: A hernia is present. Hernia is present in the umbilical area.    Neurological:     Mental Status: She is alert.     There were no vitals taken for this visit. Wt Readings from Last 3 Encounters:  04/20/19 227 lb (103 kg)  09/11/18 179 lb (81.2 kg)  07/12/18 179 lb (81.2 kg)     Health Maintenance Due  Topic Date Due  . Hepatitis C Screening  Never done  . HIV Screening  Never done    There are no preventive care reminders to display for this patient.  Lab Results  Component Value Date   TSH 1.18 07/16/2015   Lab Results  Component Value Date   WBC 9.1 07/16/2015   HGB 12.9 07/16/2015   HCT 39.1 07/16/2015   MCV 94.2 07/16/2015   PLT 219 07/16/2015   Lab Results  Component Value Date   NA 138 07/16/2015   K 4.6 07/16/2015   CO2 20 07/16/2015   GLUCOSE 77 07/16/2015   BUN 12 07/16/2015   CREATININE 0.60 07/16/2015   BILITOT 0.3 07/16/2015   ALKPHOS 77 07/16/2015   AST 25 07/16/2015   ALT 28 07/16/2015    PROT 6.1 07/16/2015   ALBUMIN 3.8 07/16/2015   CALCIUM 8.4 (L) 07/16/2015   Lab Results  Component Value Date   CHOL 212 (H) 09/17/2015   Lab Results  Component Value Date   HDL 31 (L) 09/17/2015  Lab Results  Component Value Date   LDLCALC 151 (H) 09/17/2015   Lab Results  Component Value Date   TRIG 152 (H) 09/17/2015   Lab Results  Component Value Date   CHOLHDL 6.8 (H) 09/17/2015   No results found for: HGBA1C    Assessment & Plan:   1. Abdominal wall mass Palpable, reducible mass near the umbilicus present for the past several months. New onset of intermittent pain to the area after eating on occasion. No warning signs present or incarceration. It is not clear if this is a possible fat containing hernia or intestinal herniation. The patient has had laparoscopic surgery for cholecystectomy in June 2020 with entry point near the area of the mass. She also had recent pregnancy with Caesarian section 9 weeks ago. Due to the possible scar tissue present, an abdominal ultrasound may not be effective in observing the hernia. We discussed the option of CT scan to determine the contents of the hernia, but the patient states she wishes to wait a while longer for insurance purposes. I will place a referral today for general surgery for evaluation of the mass to determine if surgical intervention is required for treatment. Discussed emergency symptoms that would warrant immediate evaluation.   PLAN:  - Watchful waiting on CT scan unless emergent symptoms present.  - Referral to general surgery for evaluation  - Follow-up if symptoms worsen or fail to improve  - Ambulatory referral to . Jermany Sundell, NP

## 2019-07-31 DIAGNOSIS — Z8632 Personal history of gestational diabetes: Secondary | ICD-10-CM | POA: Diagnosis not present

## 2019-08-10 ENCOUNTER — Encounter: Payer: Self-pay | Admitting: Nurse Practitioner

## 2019-08-13 ENCOUNTER — Telehealth (INDEPENDENT_AMBULATORY_CARE_PROVIDER_SITE_OTHER): Payer: BC Managed Care – PPO | Admitting: Physician Assistant

## 2019-08-13 ENCOUNTER — Encounter: Payer: Self-pay | Admitting: Physician Assistant

## 2019-08-13 ENCOUNTER — Other Ambulatory Visit: Payer: Self-pay

## 2019-08-13 VITALS — BP 114/79 | HR 101 | Temp 97.6°F | Ht 62.0 in | Wt 191.0 lb

## 2019-08-13 DIAGNOSIS — R197 Diarrhea, unspecified: Secondary | ICD-10-CM

## 2019-08-13 DIAGNOSIS — M545 Low back pain, unspecified: Secondary | ICD-10-CM

## 2019-08-13 DIAGNOSIS — R110 Nausea: Secondary | ICD-10-CM | POA: Diagnosis not present

## 2019-08-13 DIAGNOSIS — R1084 Generalized abdominal pain: Secondary | ICD-10-CM | POA: Diagnosis not present

## 2019-08-13 LAB — POCT URINALYSIS DIP (CLINITEK)
Bilirubin, UA: NEGATIVE
Blood, UA: NEGATIVE
Glucose, UA: NEGATIVE mg/dL
Ketones, POC UA: NEGATIVE mg/dL
Nitrite, UA: NEGATIVE
POC PROTEIN,UA: NEGATIVE
Spec Grav, UA: 1.015 (ref 1.010–1.025)
Urobilinogen, UA: 0.2 E.U./dL
pH, UA: 6 (ref 5.0–8.0)

## 2019-08-13 NOTE — Progress Notes (Signed)
Came on suddenly Saturday evening (around 5 pm) Abdominal pain - cramping Diarrhea Nausea Chills Joint aches No fever  Feeling a little better today, still having "gurgling" in stomach and diarrhea Taking Tylenol and imodium

## 2019-08-13 NOTE — Patient Instructions (Signed)

## 2019-08-13 NOTE — Progress Notes (Signed)
Subjective:    Patient ID: Cynthia Christensen, female    DOB: [birth year], 34 y.o.   MRN: [id number]  HPI  Patient is a 34 year old female who presents to the clinic with sudden diarrhea that started Saturday evening around 5 PM.  Her diarrhea was accompanied by nausea but no vomiting and abdominal cramping.  She did have some chills and joint aches.  She does have Zofran at her house.  She denies any fever, chills, body aches, loss of smell or taste, cough, sinus pressure, ear pain.  Sunday she was completely drained but her diarrhea was better.  She still has a gurgling in her stomach and loose stools but they are not nearly as frequent.  She is taking Tylenol and Imodium. No obvious reason in her food for food poisoning.  She is clearly concerned about her young 69-month-old daughter. She noticed some low back pain yesterday. No urinary symptoms.   .. Active Ambulatory Problems    Diagnosis Date Noted  . Candidal skin infection 05/03/2012  . Tinea versicolor 05/03/2012  . Tobacco use 05/03/2012  . Hirsutism 05/22/2012  . Hyperlipidemia 05/22/2012  . Personal history of other specified conditions 08/07/2012  . Acne inversa 08/07/2012  . 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 Lip swelling 09/19/2015  . Tongue swelling 09/19/2015  . Allergic reaction to food 09/19/2015  . Status post cholecystectomy 07/12/2018  . Witnessed episode of apnea 07/12/2018  . Class 1 obesity due to excess calories without serious comorbidity with body mass index (BMI) of 32.0 to 32.9 in adult 07/12/2018  . 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 Snoring 07/12/2018   Resolved Ambulatory Problems    Diagnosis Date Noted  . No Resolved Ambulatory Problems   Past Medical History:  Diagnosis Date  . History of HPV infection      Review of Systems    see HPI.  Objective:   Physical Exam Vitals reviewed.  Constitutional:      Appearance: She is well-developed. She is obese.  Cardiovascular:     Rate and Rhythm: Normal  rate and regular rhythm.  Pulmonary:     Effort: Pulmonary effort is normal.     Breath sounds: Normal breath sounds.  Abdominal:     General: Bowel sounds are normal.     Palpations: Abdomen is soft. There is no mass.     Tenderness: There is generalized abdominal tenderness. There is no right CVA tenderness or left CVA tenderness.  Neurological:     General: No focal deficit present.     Mental Status: She is alert.  Psychiatric:        Mood and Affect: Mood normal.           Assessment & Plan:  Marland KitchenMarland KitchenNovalyn was seen today for diarrhea and abdominal pain.  Diagnoses and all orders for this visit:  Diarrhea, unspecified type -     Novel Coronavirus, NAA ([facility])  Nausea -     Novel Coronavirus, NAA ([facility])  Acute bilateral low back pain without sciatica -     POCT URINALYSIS DIP ([facility]) -     Urine Culture  Generalized abdominal pain -     POCT URINALYSIS DIP ([facility]) -     Novel Coronavirus, NAA ([facility])   .[name] Results for orders placed or performed in visit on 08/13/19  POCT URINALYSIS DIP ([facility])  Result Value Ref Range   Color, UA yellow yellow   Clarity, UA clear clear   Glucose, UA negative negative mg/dL   Bilirubin,  UA negative negative   Ketones, POC UA negative negative mg/dL   Spec Grav, UA 1.015 1.010 - 1.025   Blood, UA negative negative   pH, UA 6.0 5.0 - 8.0   POC PROTEIN,UA negative negative, trace   Urobilinogen, UA 0.2 0.2 or 1.0 E.U./dL   Nitrite, UA Negative Negative   Leukocytes, UA Trace (A) Negative    Likely her symptoms represent a viral gastroenteritis.  Less likely food poisoning with no one else in the house having this so suddenly.  There is some concern about Covid especially with a young infant.  We will test her for this today.  Likelihood is low.  Concerned about UTI complication after so much diarrhea.  Will check urine today.  There were some leukocytes.  Will send for culture.  Discussed brat diet and hydration.  Can  use Zofran as needed.  She should continue to get better if this is viral.  If symptoms get worse or change please contact office.  Discussed with patient viral gastroenteritis is going around.  It is important to keep a close watch on her young daughter.  I would consider reaching out to pediatrician to let them know you have had what we suspect viral gastroenteritis.  If you start noticing daughter vomiting may like to have some Zofran to use to keep her from ED.

## 2019-08-14 ENCOUNTER — Encounter: Payer: Self-pay | Admitting: Physician Assistant

## 2019-08-14 DIAGNOSIS — R197 Diarrhea, unspecified: Secondary | ICD-10-CM

## 2019-08-14 MED ORDER — AMOXICILLIN-POT CLAVULANATE 500-125 MG PO TABS: 1.0000 | ORAL_TABLET | Freq: Three times a day (TID) | ORAL | 0 refills | Status: DC

## 2019-08-14 NOTE — Telephone Encounter (Signed)
Sent referral to [facility] as patient requested. - CF

## 2019-08-15 DIAGNOSIS — R197 Diarrhea, unspecified: Secondary | ICD-10-CM | POA: Diagnosis not present

## 2019-08-15 DIAGNOSIS — R1084 Generalized abdominal pain: Secondary | ICD-10-CM | POA: Diagnosis not present

## 2019-08-15 LAB — URINE CULTURE
MICRO NUMBER:: 10721758
SPECIMEN QUALITY:: ADEQUATE

## 2019-08-15 NOTE — Telephone Encounter (Signed)
Ok great. I though we ordered Friday. That's a better timeline.

## 2019-08-15 NOTE — Telephone Encounter (Signed)
Why don't we have covid results back?

## 2019-08-15 NOTE — Progress Notes (Signed)
Cynthia Christensen,   Strep found in urine. Augmentin that was sent should treat this as well.

## 2019-08-15 NOTE — Telephone Encounter (Signed)
Called [facility]. They just received specimen yesterday (ordered Monday). They are still working on result.

## 2019-08-16 LAB — NOVEL CORONAVIRUS, NAA: SARS-CoV-2, NAA: NOT DETECTED

## 2019-08-16 LAB — SARS-COV-2, NAA 2 DAY TAT

## 2019-08-17 ENCOUNTER — Encounter: Payer: Self-pay | Admitting: Physician Assistant

## 2019-08-17 MED ORDER — DIPHENOXYLATE-ATROPINE 2.5-0.025 MG PO TABS: ORAL_TABLET | ORAL | 0 refills | Status: DC

## 2019-08-17 NOTE — Progress Notes (Signed)
Cynthia Christensen,   Finally back. Negative covid. How are your symptoms? Did you get the stool cultures back to lab?

## 2019-08-17 NOTE — Telephone Encounter (Signed)
Prelim results pulled.

## 2019-08-17 NOTE — Telephone Encounter (Signed)
Can we call lab and see about prelim stool studies?

## 2019-08-20 NOTE — Telephone Encounter (Signed)
Final stool culture. No bacteria found in stool.

## 2019-08-21 NOTE — Telephone Encounter (Signed)
No C. diff. Certainly if you are still having diarrhea I believe this is more of a IBS component. We could give some bentyl to take to help the gut motility.

## 2019-08-23 LAB — STOOL CULTURE
MICRO NUMBER:: 10734018
MICRO NUMBER:: 10734019
MICRO NUMBER:: 10734020
SHIGA RESULT:: NOT DETECTED
SPECIMEN QUALITY:: ADEQUATE
SPECIMEN QUALITY:: ADEQUATE
SPECIMEN QUALITY:: ADEQUATE

## 2019-08-23 LAB — OVA AND PARASITE EXAMINATION
CONCENTRATE RESULT:: NONE SEEN
MICRO NUMBER:: 10733040
SPECIMEN QUALITY:: ADEQUATE
TRICHROME RESULT:: NONE SEEN

## 2019-08-23 LAB — CLOSTRIDIUM DIFFICILE CULTURE-FECAL

## 2019-08-27 DIAGNOSIS — K432 Incisional hernia without obstruction or gangrene: Secondary | ICD-10-CM | POA: Diagnosis not present

## 2019-08-27 NOTE — Telephone Encounter (Signed)
FYI: negative for ova and parasite testing as well.

## 2019-08-29 ENCOUNTER — Encounter: Payer: Self-pay | Admitting: Physician Assistant

## 2019-09-01 DIAGNOSIS — R519 Headache, unspecified: Secondary | ICD-10-CM | POA: Diagnosis not present

## 2019-09-01 DIAGNOSIS — Z20822 Contact with and (suspected) exposure to covid-19: Secondary | ICD-10-CM | POA: Diagnosis not present

## 2019-09-01 DIAGNOSIS — R05 Cough: Secondary | ICD-10-CM | POA: Diagnosis not present

## 2019-09-01 DIAGNOSIS — R0981 Nasal congestion: Secondary | ICD-10-CM | POA: Diagnosis not present

## 2019-09-24 DIAGNOSIS — N898 Other specified noninflammatory disorders of vagina: Secondary | ICD-10-CM | POA: Diagnosis not present

## 2019-09-24 DIAGNOSIS — N760 Acute vaginitis: Secondary | ICD-10-CM | POA: Diagnosis not present

## 2019-10-08 DIAGNOSIS — K430 Incisional hernia with obstruction, without gangrene: Secondary | ICD-10-CM | POA: Diagnosis not present

## 2019-10-08 DIAGNOSIS — Z79899 Other long term (current) drug therapy: Secondary | ICD-10-CM | POA: Diagnosis not present

## 2019-10-08 DIAGNOSIS — K436 Other and unspecified ventral hernia with obstruction, without gangrene: Secondary | ICD-10-CM | POA: Diagnosis not present

## 2019-10-08 DIAGNOSIS — Z6837 Body mass index (BMI) 37.0-37.9, adult: Secondary | ICD-10-CM | POA: Diagnosis not present

## 2019-10-08 DIAGNOSIS — Z9189 Other specified personal risk factors, not elsewhere classified: Secondary | ICD-10-CM | POA: Diagnosis not present

## 2019-10-08 DIAGNOSIS — Z8632 Personal history of gestational diabetes: Secondary | ICD-10-CM | POA: Diagnosis not present

## 2019-10-08 DIAGNOSIS — K432 Incisional hernia without obstruction or gangrene: Secondary | ICD-10-CM | POA: Diagnosis not present

## 2019-10-08 DIAGNOSIS — E669 Obesity, unspecified: Secondary | ICD-10-CM | POA: Diagnosis not present

## 2019-10-08 DIAGNOSIS — F419 Anxiety disorder, unspecified: Secondary | ICD-10-CM | POA: Diagnosis not present

## 2019-10-08 DIAGNOSIS — D62 Acute posthemorrhagic anemia: Secondary | ICD-10-CM | POA: Diagnosis not present

## 2019-10-08 DIAGNOSIS — Z87891 Personal history of nicotine dependence: Secondary | ICD-10-CM | POA: Diagnosis not present

## 2019-10-08 DIAGNOSIS — Z9049 Acquired absence of other specified parts of digestive tract: Secondary | ICD-10-CM | POA: Diagnosis not present

## 2019-10-08 DIAGNOSIS — J45909 Unspecified asthma, uncomplicated: Secondary | ICD-10-CM | POA: Diagnosis not present

## 2019-10-08 DIAGNOSIS — E785 Hyperlipidemia, unspecified: Secondary | ICD-10-CM | POA: Diagnosis not present

## 2019-10-11 DIAGNOSIS — H10213 Acute toxic conjunctivitis, bilateral: Secondary | ICD-10-CM | POA: Diagnosis not present

## 2019-10-19 DIAGNOSIS — H10213 Acute toxic conjunctivitis, bilateral: Secondary | ICD-10-CM | POA: Diagnosis not present

## 2019-12-05 DIAGNOSIS — Z3169 Encounter for other general counseling and advice on procreation: Secondary | ICD-10-CM | POA: Diagnosis not present

## 2019-12-12 DIAGNOSIS — N979 Female infertility, unspecified: Secondary | ICD-10-CM | POA: Diagnosis not present

## 2019-12-12 DIAGNOSIS — Z3141 Encounter for fertility testing: Secondary | ICD-10-CM | POA: Diagnosis not present

## 2020-02-13 DIAGNOSIS — Z3141 Encounter for fertility testing: Secondary | ICD-10-CM | POA: Diagnosis not present

## 2020-02-13 DIAGNOSIS — N979 Female infertility, unspecified: Secondary | ICD-10-CM | POA: Diagnosis not present

## 2020-02-20 DIAGNOSIS — N979 Female infertility, unspecified: Secondary | ICD-10-CM | POA: Diagnosis not present

## 2020-02-28 DIAGNOSIS — Z3200 Encounter for pregnancy test, result unknown: Secondary | ICD-10-CM | POA: Diagnosis not present

## 2020-03-03 DIAGNOSIS — Z3200 Encounter for pregnancy test, result unknown: Secondary | ICD-10-CM | POA: Diagnosis not present

## 2020-03-17 DIAGNOSIS — O0901 Supervision of pregnancy with history of infertility, first trimester: Secondary | ICD-10-CM | POA: Diagnosis not present

## 2020-03-17 DIAGNOSIS — Z3490 Encounter for supervision of normal pregnancy, unspecified, unspecified trimester: Secondary | ICD-10-CM | POA: Diagnosis not present

## 2020-03-26 DIAGNOSIS — Z3141 Encounter for fertility testing: Secondary | ICD-10-CM | POA: Diagnosis not present

## 2020-03-26 DIAGNOSIS — Z3A00 Weeks of gestation of pregnancy not specified: Secondary | ICD-10-CM | POA: Diagnosis not present

## 2020-03-26 DIAGNOSIS — O09811 Supervision of pregnancy resulting from assisted reproductive technology, first trimester: Secondary | ICD-10-CM | POA: Diagnosis not present

## 2020-04-08 ENCOUNTER — Other Ambulatory Visit: Payer: Self-pay

## 2020-04-08 ENCOUNTER — Encounter: Payer: Self-pay | Admitting: Medical-Surgical

## 2020-04-08 ENCOUNTER — Telehealth (INDEPENDENT_AMBULATORY_CARE_PROVIDER_SITE_OTHER): Payer: BC Managed Care – PPO | Admitting: Medical-Surgical

## 2020-04-08 DIAGNOSIS — J309 Allergic rhinitis, unspecified: Secondary | ICD-10-CM

## 2020-04-08 NOTE — Progress Notes (Signed)
Virtual Visit via Video Note  I connected with Cynthia Christensen on 04/08/20 at  2:40 PM EDT by a video enabled telemedicine application and verified that I am speaking with the correct person using two identifiers.   I discussed the limitations of evaluation and management by telemedicine and the availability of in person appointments. The patient expressed understanding and agreed to proceed.  Patient location: home Provider locations: office  Subjective:    CC: sinus issues  HPI: Pleasant 35 year old female presenting via MyChart video visit with reports of sinus issues.  She is currently [redacted] weeks pregnant.  Recently took a trip to [place] for 5 days and returned last week.  She notes that she returned to town on Tuesday and on Wednesday, her allergies kicked in.  She has been managing this with Claritin but unfortunately her sinus congestion has worsened and have been severe since yesterday.  She has quite a bit of postnasal drip and now is experiencing a very sore throat.  She has looked in the mirror and notes that her posterior oropharynx is very red but does not note any exudate.  She did Covid test upon her return from [place] and again yesterday with both test being negative.  She does have some swelling to her submandibular lymph nodes but no fever or cough.  Unable to determine if she has any GI symptoms as she cannot separate morning sickness from possible illness at this point.  Past medical history, Surgical history, Family history not pertinant except as noted below, Social history, Allergies, and medications have been entered into the medical record, reviewed, and corrections made.   Review of Systems: See HPI for pertinent positives and negatives.   Objective:    General: Speaking clearly in complete sentences without any shortness of breath.  Alert and oriented x3.  Normal judgment. No apparent acute distress.  Impression and Recommendations:    1. Allergic rhinitis, unspecified  seasonality, unspecified trigger Consider switching from Claritin to Zyrtec or Allegra as both of these are pregnancy approved.  Also consider using Flonase or Nasonex.  Atrovent is approved in pregnancy but she would like to defer this today.  Continue Nettie pot/nasal saline.  Tylenol for sore throat as needed.  Can also use sore throat lozenges such as Cepacol.  I discussed the assessment and treatment plan with the patient. The patient was provided an opportunity to ask questions and all were answered. The patient agreed with the plan and demonstrated an understanding of the instructions.   The patient was advised to call back or seek an in-person evaluation if the symptoms worsen or if the condition fails to improve as anticipated.  20 minutes of non-face-to-face time was provided during this encounter.  Return if symptoms worsen or fail to improve.  Clearnce Sorrel, DNP, APRN, FNP-BC [facility]

## 2020-04-15 DIAGNOSIS — O09299 Supervision of pregnancy with other poor reproductive or obstetric history, unspecified trimester: Secondary | ICD-10-CM | POA: Diagnosis not present

## 2020-04-25 DIAGNOSIS — Z8632 Personal history of gestational diabetes: Secondary | ICD-10-CM | POA: Diagnosis not present

## 2020-04-25 DIAGNOSIS — Z113 Encounter for screening for infections with a predominantly sexual mode of transmission: Secondary | ICD-10-CM | POA: Diagnosis not present

## 2020-04-25 DIAGNOSIS — Z3A12 12 weeks gestation of pregnancy: Secondary | ICD-10-CM | POA: Diagnosis not present

## 2020-04-25 DIAGNOSIS — O09521 Supervision of elderly multigravida, first trimester: Secondary | ICD-10-CM | POA: Diagnosis not present

## 2020-04-25 DIAGNOSIS — Z3480 Encounter for supervision of other normal pregnancy, unspecified trimester: Secondary | ICD-10-CM | POA: Diagnosis not present

## 2020-04-25 DIAGNOSIS — Z369 Encounter for antenatal screening, unspecified: Secondary | ICD-10-CM | POA: Diagnosis not present

## 2020-05-06 DIAGNOSIS — R7989 Other specified abnormal findings of blood chemistry: Secondary | ICD-10-CM | POA: Diagnosis not present

## 2020-06-04 DIAGNOSIS — Z3A16 16 weeks gestation of pregnancy: Secondary | ICD-10-CM | POA: Diagnosis not present

## 2020-06-04 DIAGNOSIS — O09521 Supervision of elderly multigravida, first trimester: Secondary | ICD-10-CM | POA: Diagnosis not present

## 2020-06-09 DIAGNOSIS — Z3A16 16 weeks gestation of pregnancy: Secondary | ICD-10-CM | POA: Diagnosis not present

## 2020-06-09 DIAGNOSIS — O09522 Supervision of elderly multigravida, second trimester: Secondary | ICD-10-CM | POA: Diagnosis not present

## 2020-06-09 DIAGNOSIS — O09812 Supervision of pregnancy resulting from assisted reproductive technology, second trimester: Secondary | ICD-10-CM | POA: Diagnosis not present

## 2020-06-09 DIAGNOSIS — Z98891 History of uterine scar from previous surgery: Secondary | ICD-10-CM | POA: Diagnosis not present

## 2020-06-09 DIAGNOSIS — E669 Obesity, unspecified: Secondary | ICD-10-CM | POA: Diagnosis not present

## 2020-07-08 DIAGNOSIS — Z3482 Encounter for supervision of other normal pregnancy, second trimester: Secondary | ICD-10-CM | POA: Diagnosis not present

## 2020-07-08 DIAGNOSIS — R7309 Other abnormal glucose: Secondary | ICD-10-CM | POA: Diagnosis not present

## 2020-07-09 DIAGNOSIS — R7989 Other specified abnormal findings of blood chemistry: Secondary | ICD-10-CM | POA: Diagnosis not present

## 2020-07-14 DIAGNOSIS — O09812 Supervision of pregnancy resulting from assisted reproductive technology, second trimester: Secondary | ICD-10-CM | POA: Diagnosis not present

## 2020-07-14 DIAGNOSIS — E669 Obesity, unspecified: Secondary | ICD-10-CM | POA: Diagnosis not present

## 2020-07-14 DIAGNOSIS — O99212 Obesity complicating pregnancy, second trimester: Secondary | ICD-10-CM | POA: Diagnosis not present

## 2020-07-14 DIAGNOSIS — O0992 Supervision of high risk pregnancy, unspecified, second trimester: Secondary | ICD-10-CM | POA: Diagnosis not present

## 2020-07-15 DIAGNOSIS — O09812 Supervision of pregnancy resulting from assisted reproductive technology, second trimester: Secondary | ICD-10-CM | POA: Diagnosis not present

## 2020-07-15 DIAGNOSIS — O09819 Supervision of pregnancy resulting from assisted reproductive technology, unspecified trimester: Secondary | ICD-10-CM | POA: Diagnosis not present

## 2020-08-04 ENCOUNTER — Ambulatory Visit: Payer: BC Managed Care – PPO | Admitting: Physician Assistant

## 2020-08-05 ENCOUNTER — Ambulatory Visit (INDEPENDENT_AMBULATORY_CARE_PROVIDER_SITE_OTHER): Payer: BC Managed Care – PPO | Admitting: Physician Assistant

## 2020-08-05 ENCOUNTER — Other Ambulatory Visit: Payer: Self-pay

## 2020-08-05 ENCOUNTER — Encounter: Payer: Self-pay | Admitting: Physician Assistant

## 2020-08-05 VITALS — BP 118/73 | HR 79 | Temp 97.8°F | Ht 62.0 in | Wt 204.0 lb

## 2020-08-05 DIAGNOSIS — J40 Bronchitis, not specified as acute or chronic: Secondary | ICD-10-CM

## 2020-08-05 DIAGNOSIS — J209 Acute bronchitis, unspecified: Secondary | ICD-10-CM

## 2020-08-05 DIAGNOSIS — J329 Chronic sinusitis, unspecified: Secondary | ICD-10-CM | POA: Diagnosis not present

## 2020-08-05 MED ORDER — PREDNISONE 20 MG PO TABS: 20.0000 mg | ORAL_TABLET | Freq: Every day | ORAL | 0 refills | Status: DC

## 2020-08-05 MED ORDER — AZITHROMYCIN 250 MG PO TABS
ORAL_TABLET | ORAL | 0 refills | Status: DC
Start: 2020-08-05 — End: 2021-12-25

## 2020-08-05 NOTE — Progress Notes (Signed)
Subjective:    Patient ID: Cynthia Christensen, female    DOB: 04-Jan-1986, 35 y.o.   MRN: [id number]  HPI Pt is a 35 yo female who is [redacted] weeks pregnant who presents to the clinic with over a week of URI symptoms moving into chest. Now she feels like her chest is tight and congested but struggling to "get stuff up". She does have a productive cough and her chest and breathing is getting tighter. She is fatigued. She has low grade fever at the beginning. Her daughter had similar symptoms first. She has been treated with abx and doing better. Tested negative for covid. Pt is vaccinated x2.   .. 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  Constitutional:      Appearance: Normal appearance.  HENT:     Head: Normocephalic.     Right Ear: Tympanic membrane and external ear normal. There is no impacted cerumen.     Left Ear: Tympanic membrane and external ear normal. There is no impacted cerumen.     Nose: Congestion present.     Mouth/Throat:     Mouth: Mucous membranes are moist.     Pharynx: Posterior oropharyngeal erythema  present.  Eyes:     Extraocular Movements: Extraocular movements intact.     Conjunctiva/sclera: Conjunctivae normal.     Pupils: Pupils are equal, round, and reactive to light.  Cardiovascular:     Rate and Rhythm: Normal rate and regular rhythm.     Pulses: Normal pulses.  Pulmonary:     Effort: Pulmonary effort is normal.     Breath sounds: Wheezing present.     Comments: Wheezing in left lower and middle lung base.  Musculoskeletal:     Cervical back: Normal range of motion.  Lymphadenopathy:     Cervical: Cervical adenopathy present.  Neurological:     General: No focal deficit present.     Mental Status: She is alert and oriented to person, place, and time.  Psychiatric:        Mood and Affect: Mood normal.         Assessment & Plan:  Marland KitchenMarland KitchenBently was seen today for cough and sinus problem.  Diagnoses and all orders for this visit:  Sinobronchitis -     azithromycin (ZITHROMAX Z-PAK) 250 MG tablet; Take 2 tablets (500 mg) on  Day 1,  followed by 1 tablet (250 mg) once daily on Days 2 through 5. -     predniSONE (DELTASONE) 20 MG tablet; Take 1 tablet (20 mg total) by mouth daily with breakfast.  Negative for covid.  Covid vaccine x2 completed.  Discussed symptomatic care. Added zpak due to over a week of symptoms. Low dose prednisone given for wheezing in lungs and inflammation. Hold for 2 days and see if zpak alone helps with symptoms.  Stay hydrated.  Follow up if symptoms, especially breathing worsens.

## 2020-08-05 NOTE — Patient Instructions (Signed)
Acute Bronchitis, Adult  Acute bronchitis is when air tubes in the lungs (bronchi) suddenly get swollen. The condition can make it hard for you to breathe. In adults, acute bronchitis usually goes away within 2 weeks. A cough caused by bronchitis may last up to 3 weeks. Smoking, allergies, and asthma can make thecondition worse. What are the causes? This condition is caused by: Cold and flu viruses. The most common cause of this condition is the virus that causes the common cold. Bacteria. Substances that irritate the lungs, including: Smoke from cigarettes and other types of tobacco. Dust and pollen. Fumes from chemicals, gases, or burned fuel. Other materials that pollute indoor or outdoor air. Close contact with someone who has acute bronchitis. What increases the risk? The following factors may make you more likely to develop this condition: A weak body's defense system. This is also called the immune system. Any condition that affects your lungs and breathing, such as asthma. What are the signs or symptoms? Symptoms of this condition include: A cough. Coughing up clear, yellow, or green mucus. Wheezing. Having too much mucus in your lungs (chest congestion). Shortness of breath. A fever. Chills. Body aches. A sore throat. How is this treated? Acute bronchitis may go away over time without treatment. Your doctor may recommend: Drinking more fluids. Using a device that gets medicine into your lungs (inhaler). Using a vaporizer or a humidifier. These are machines that add water or moisture to the air. This helps with coughing and poor breathing. Taking a medicine for fever. Taking a medicine that thins mucus and clears congestion. Taking a medicine that prevents or stops coughing. Follow these instructions at home: Activity Get a lot of rest. Return to your normal activities as told by your doctor. Ask your doctor what activities are safe for you. Lifestyle  Drink enough  fluid to keep your pee (urine) pale yellow. Do not drink alcohol. Do not use any products that contain nicotine or tobacco, such as cigarettes, e-cigarettes, and chewing tobacco. If you need help quitting, ask your doctor. Be aware that: Your bronchitis will get worse if you smoke or breathe in other people's smoke (secondhand smoke). Your lungs will heal faster if you quit smoking.  General instructions Take over-the-counter and prescription medicines only as told by your doctor. Use an inhaler, cool mist vaporizer, or humidifier as told by your doctor. Rinse your mouth often with salt water. To make salt water, dissolve -1 tsp (3-6 g) of salt in 1 cup (237 mL) of warm water. Take two teaspoons of honey at bedtime. This helps lessen your coughing at night. Keep all follow-up visits as told by your doctor. This is important. How is this prevented? To lower your risk of getting this condition again: Wash your hands often with soap and water. If you cannot use soap and water, use hand sanitizer. Avoid contact with people who have cold symptoms. Try not to touch your mouth, nose, or eyes with your hands. Make sure to get the flu shot every year. Contact a doctor if: Your symptoms do not get better in 2 weeks. You vomit more than once or twice. You have symptoms of loss of fluid from your body (dehydration). These include: Dark pee. Dry skin or eyes. Increased thirst. Headaches. Confusion. Muscle cramps. Get help right away if: You cough up blood. You have chest pain. You have very bad shortness of breath. You become dehydrated. You faint or keep feeling like you are going to faint. You   have a very bad headache. Your fever or chills get worse. These symptoms may be an emergency. Get help right away. Call your local emergency services (911 in the [place] Do not wait to see if the symptoms will go away. Do not drive yourself to the hospital. Summary Acute bronchitis is when air  tubes in the lungs (bronchi) suddenly get swollen. In adults, acute bronchitis usually goes away within 2 weeks. Take over-the-counter and prescription medicines only as told by your doctor. Drink enough fluid to keep your pee (urine) pale yellow. Contact a doctor if your symptoms do not improve after 2 weeks of treatment. Get help right away if you cough up blood, faint, or have chest pain or shortness of breath. This information is not intended to replace advice given to you by your health care provider. Make sure you discuss any questions you have with your healthcare provider. Document Revised: 12/12/2019 Document Reviewed: 08/04/2018 Elsevier Patient Education  2022 Elsevier Inc.  

## 2020-08-06 ENCOUNTER — Encounter: Payer: Self-pay | Admitting: Physician Assistant

## 2020-08-06 DIAGNOSIS — R7309 Other abnormal glucose: Secondary | ICD-10-CM | POA: Diagnosis not present

## 2020-08-11 DIAGNOSIS — O99212 Obesity complicating pregnancy, second trimester: Secondary | ICD-10-CM | POA: Diagnosis not present

## 2020-08-11 DIAGNOSIS — Z8759 Personal history of other complications of pregnancy, childbirth and the puerperium: Secondary | ICD-10-CM | POA: Diagnosis not present

## 2020-08-11 DIAGNOSIS — E669 Obesity, unspecified: Secondary | ICD-10-CM | POA: Diagnosis not present

## 2020-08-11 DIAGNOSIS — O24414 Gestational diabetes mellitus in pregnancy, insulin controlled: Secondary | ICD-10-CM | POA: Diagnosis not present

## 2020-08-19 DIAGNOSIS — R7989 Other specified abnormal findings of blood chemistry: Secondary | ICD-10-CM | POA: Diagnosis not present

## 2020-08-25 DIAGNOSIS — Z23 Encounter for immunization: Secondary | ICD-10-CM | POA: Diagnosis not present

## 2020-08-25 DIAGNOSIS — Z3A29 29 weeks gestation of pregnancy: Secondary | ICD-10-CM | POA: Diagnosis not present

## 2020-09-03 DIAGNOSIS — O99810 Abnormal glucose complicating pregnancy: Secondary | ICD-10-CM | POA: Diagnosis not present

## 2020-09-15 DIAGNOSIS — O24414 Gestational diabetes mellitus in pregnancy, insulin controlled: Secondary | ICD-10-CM | POA: Diagnosis not present

## 2020-09-15 DIAGNOSIS — E669 Obesity, unspecified: Secondary | ICD-10-CM | POA: Diagnosis not present

## 2020-09-15 DIAGNOSIS — O99213 Obesity complicating pregnancy, third trimester: Secondary | ICD-10-CM | POA: Diagnosis not present

## 2020-09-15 DIAGNOSIS — Z8759 Personal history of other complications of pregnancy, childbirth and the puerperium: Secondary | ICD-10-CM | POA: Diagnosis not present

## 2020-09-22 DIAGNOSIS — O99213 Obesity complicating pregnancy, third trimester: Secondary | ICD-10-CM | POA: Diagnosis not present

## 2020-09-22 DIAGNOSIS — Z3A34 34 weeks gestation of pregnancy: Secondary | ICD-10-CM | POA: Diagnosis not present

## 2020-09-22 DIAGNOSIS — O34211 Maternal care for low transverse scar from previous cesarean delivery: Secondary | ICD-10-CM | POA: Diagnosis not present

## 2020-09-22 DIAGNOSIS — E669 Obesity, unspecified: Secondary | ICD-10-CM | POA: Diagnosis not present

## 2020-09-22 DIAGNOSIS — R399 Unspecified symptoms and signs involving the genitourinary system: Secondary | ICD-10-CM | POA: Diagnosis not present

## 2020-09-23 ENCOUNTER — Encounter: Payer: Self-pay | Admitting: Physician Assistant

## 2020-09-25 DIAGNOSIS — O99810 Abnormal glucose complicating pregnancy: Secondary | ICD-10-CM | POA: Diagnosis not present

## 2020-09-30 DIAGNOSIS — Z8759 Personal history of other complications of pregnancy, childbirth and the puerperium: Secondary | ICD-10-CM | POA: Diagnosis not present

## 2020-09-30 DIAGNOSIS — O99213 Obesity complicating pregnancy, third trimester: Secondary | ICD-10-CM | POA: Diagnosis not present

## 2020-09-30 DIAGNOSIS — O24414 Gestational diabetes mellitus in pregnancy, insulin controlled: Secondary | ICD-10-CM | POA: Diagnosis not present

## 2020-09-30 DIAGNOSIS — Z3A34 34 weeks gestation of pregnancy: Secondary | ICD-10-CM | POA: Diagnosis not present

## 2020-10-01 DIAGNOSIS — Z3A34 34 weeks gestation of pregnancy: Secondary | ICD-10-CM | POA: Diagnosis not present

## 2020-10-01 DIAGNOSIS — R160 Hepatomegaly, not elsewhere classified: Secondary | ICD-10-CM | POA: Diagnosis not present

## 2020-10-01 DIAGNOSIS — O26893 Other specified pregnancy related conditions, third trimester: Secondary | ICD-10-CM | POA: Diagnosis not present

## 2020-10-01 DIAGNOSIS — Z9049 Acquired absence of other specified parts of digestive tract: Secondary | ICD-10-CM | POA: Diagnosis not present

## 2020-10-01 DIAGNOSIS — R1031 Right lower quadrant pain: Secondary | ICD-10-CM | POA: Diagnosis not present

## 2020-10-01 DIAGNOSIS — Z87891 Personal history of nicotine dependence: Secondary | ICD-10-CM | POA: Diagnosis not present

## 2020-10-01 DIAGNOSIS — R1033 Periumbilical pain: Secondary | ICD-10-CM | POA: Diagnosis not present

## 2020-10-02 DIAGNOSIS — R1031 Right lower quadrant pain: Secondary | ICD-10-CM | POA: Diagnosis not present

## 2020-10-02 DIAGNOSIS — Z9049 Acquired absence of other specified parts of digestive tract: Secondary | ICD-10-CM | POA: Diagnosis not present

## 2020-10-02 DIAGNOSIS — R160 Hepatomegaly, not elsewhere classified: Secondary | ICD-10-CM | POA: Diagnosis not present

## 2020-10-06 DIAGNOSIS — O99213 Obesity complicating pregnancy, third trimester: Secondary | ICD-10-CM | POA: Diagnosis not present

## 2020-10-06 DIAGNOSIS — Z3A35 35 weeks gestation of pregnancy: Secondary | ICD-10-CM | POA: Diagnosis not present

## 2020-10-06 DIAGNOSIS — Z8759 Personal history of other complications of pregnancy, childbirth and the puerperium: Secondary | ICD-10-CM | POA: Diagnosis not present

## 2020-10-06 DIAGNOSIS — O24414 Gestational diabetes mellitus in pregnancy, insulin controlled: Secondary | ICD-10-CM | POA: Diagnosis not present

## 2020-10-12 DIAGNOSIS — Z3A36 36 weeks gestation of pregnancy: Secondary | ICD-10-CM | POA: Diagnosis not present

## 2020-10-12 DIAGNOSIS — Z8659 Personal history of other mental and behavioral disorders: Secondary | ICD-10-CM | POA: Diagnosis not present

## 2020-10-12 DIAGNOSIS — O34211 Maternal care for low transverse scar from previous cesarean delivery: Secondary | ICD-10-CM | POA: Diagnosis not present

## 2020-10-12 DIAGNOSIS — O9952 Diseases of the respiratory system complicating childbirth: Secondary | ICD-10-CM | POA: Diagnosis not present

## 2020-10-12 DIAGNOSIS — O328XX0 Maternal care for other malpresentation of fetus, not applicable or unspecified: Secondary | ICD-10-CM | POA: Diagnosis not present

## 2020-10-12 DIAGNOSIS — O24424 Gestational diabetes mellitus in childbirth, insulin controlled: Secondary | ICD-10-CM | POA: Diagnosis not present

## 2020-10-12 DIAGNOSIS — O321XX0 Maternal care for breech presentation, not applicable or unspecified: Secondary | ICD-10-CM | POA: Diagnosis not present

## 2020-10-12 DIAGNOSIS — J45909 Unspecified asthma, uncomplicated: Secondary | ICD-10-CM | POA: Diagnosis not present

## 2020-10-12 DIAGNOSIS — Z87891 Personal history of nicotine dependence: Secondary | ICD-10-CM | POA: Diagnosis not present

## 2020-10-12 DIAGNOSIS — Z79899 Other long term (current) drug therapy: Secondary | ICD-10-CM | POA: Diagnosis not present

## 2020-10-12 DIAGNOSIS — O322XX0 Maternal care for transverse and oblique lie, not applicable or unspecified: Secondary | ICD-10-CM | POA: Diagnosis not present

## 2020-10-20 DIAGNOSIS — J45909 Unspecified asthma, uncomplicated: Secondary | ICD-10-CM | POA: Diagnosis not present

## 2020-10-20 DIAGNOSIS — Z7951 Long term (current) use of inhaled steroids: Secondary | ICD-10-CM | POA: Diagnosis not present

## 2020-10-20 DIAGNOSIS — O1415 Severe pre-eclampsia, complicating the puerperium: Secondary | ICD-10-CM | POA: Diagnosis not present

## 2020-10-20 DIAGNOSIS — O165 Unspecified maternal hypertension, complicating the puerperium: Secondary | ICD-10-CM | POA: Diagnosis not present

## 2020-10-20 DIAGNOSIS — O9953 Diseases of the respiratory system complicating the puerperium: Secondary | ICD-10-CM | POA: Diagnosis not present

## 2020-10-20 DIAGNOSIS — Z8349 Family history of other endocrine, nutritional and metabolic diseases: Secondary | ICD-10-CM | POA: Diagnosis not present

## 2020-10-20 DIAGNOSIS — Z8249 Family history of ischemic heart disease and other diseases of the circulatory system: Secondary | ICD-10-CM | POA: Diagnosis not present

## 2020-10-20 DIAGNOSIS — Z801 Family history of malignant neoplasm of trachea, bronchus and lung: Secondary | ICD-10-CM | POA: Diagnosis not present

## 2020-10-23 DIAGNOSIS — F419 Anxiety disorder, unspecified: Secondary | ICD-10-CM | POA: Diagnosis not present

## 2020-10-23 DIAGNOSIS — O165 Unspecified maternal hypertension, complicating the puerperium: Secondary | ICD-10-CM | POA: Diagnosis not present

## 2020-11-24 DIAGNOSIS — Z124 Encounter for screening for malignant neoplasm of cervix: Secondary | ICD-10-CM | POA: Diagnosis not present

## 2020-11-24 LAB — HM PAP SMEAR: HM Pap smear: NORMAL

## 2020-11-24 LAB — RESULTS CONSOLE HPV: CHL HPV: NEGATIVE

## 2021-01-12 DIAGNOSIS — M4725 Other spondylosis with radiculopathy, thoracolumbar region: Secondary | ICD-10-CM | POA: Diagnosis not present

## 2021-01-12 DIAGNOSIS — M4726 Other spondylosis with radiculopathy, lumbar region: Secondary | ICD-10-CM | POA: Diagnosis not present

## 2021-01-12 DIAGNOSIS — M530 Cervicocranial syndrome: Secondary | ICD-10-CM | POA: Diagnosis not present

## 2021-01-12 DIAGNOSIS — M4728 Other spondylosis with radiculopathy, sacral and sacrococcygeal region: Secondary | ICD-10-CM | POA: Diagnosis not present

## 2021-01-13 DIAGNOSIS — M4728 Other spondylosis with radiculopathy, sacral and sacrococcygeal region: Secondary | ICD-10-CM | POA: Diagnosis not present

## 2021-01-13 DIAGNOSIS — M530 Cervicocranial syndrome: Secondary | ICD-10-CM | POA: Diagnosis not present

## 2021-01-13 DIAGNOSIS — M4726 Other spondylosis with radiculopathy, lumbar region: Secondary | ICD-10-CM | POA: Diagnosis not present

## 2021-01-13 DIAGNOSIS — M4725 Other spondylosis with radiculopathy, thoracolumbar region: Secondary | ICD-10-CM | POA: Diagnosis not present

## 2021-01-14 DIAGNOSIS — M530 Cervicocranial syndrome: Secondary | ICD-10-CM | POA: Diagnosis not present

## 2021-01-14 DIAGNOSIS — M4728 Other spondylosis with radiculopathy, sacral and sacrococcygeal region: Secondary | ICD-10-CM | POA: Diagnosis not present

## 2021-01-14 DIAGNOSIS — M4725 Other spondylosis with radiculopathy, thoracolumbar region: Secondary | ICD-10-CM | POA: Diagnosis not present

## 2021-01-14 DIAGNOSIS — M4726 Other spondylosis with radiculopathy, lumbar region: Secondary | ICD-10-CM | POA: Diagnosis not present

## 2021-01-15 DIAGNOSIS — M530 Cervicocranial syndrome: Secondary | ICD-10-CM | POA: Diagnosis not present

## 2021-01-15 DIAGNOSIS — M4728 Other spondylosis with radiculopathy, sacral and sacrococcygeal region: Secondary | ICD-10-CM | POA: Diagnosis not present

## 2021-01-15 DIAGNOSIS — M4726 Other spondylosis with radiculopathy, lumbar region: Secondary | ICD-10-CM | POA: Diagnosis not present

## 2021-01-15 DIAGNOSIS — M4725 Other spondylosis with radiculopathy, thoracolumbar region: Secondary | ICD-10-CM | POA: Diagnosis not present

## 2021-01-21 DIAGNOSIS — Z3189 Encounter for other procreative management: Secondary | ICD-10-CM | POA: Diagnosis not present

## 2021-01-26 DIAGNOSIS — M530 Cervicocranial syndrome: Secondary | ICD-10-CM | POA: Diagnosis not present

## 2021-01-26 DIAGNOSIS — M4725 Other spondylosis with radiculopathy, thoracolumbar region: Secondary | ICD-10-CM | POA: Diagnosis not present

## 2021-01-26 DIAGNOSIS — M4728 Other spondylosis with radiculopathy, sacral and sacrococcygeal region: Secondary | ICD-10-CM | POA: Diagnosis not present

## 2021-01-26 DIAGNOSIS — M4726 Other spondylosis with radiculopathy, lumbar region: Secondary | ICD-10-CM | POA: Diagnosis not present

## 2021-01-27 DIAGNOSIS — M530 Cervicocranial syndrome: Secondary | ICD-10-CM | POA: Diagnosis not present

## 2021-01-27 DIAGNOSIS — M4728 Other spondylosis with radiculopathy, sacral and sacrococcygeal region: Secondary | ICD-10-CM | POA: Diagnosis not present

## 2021-01-27 DIAGNOSIS — M4726 Other spondylosis with radiculopathy, lumbar region: Secondary | ICD-10-CM | POA: Diagnosis not present

## 2021-01-27 DIAGNOSIS — M4725 Other spondylosis with radiculopathy, thoracolumbar region: Secondary | ICD-10-CM | POA: Diagnosis not present

## 2021-01-28 DIAGNOSIS — M530 Cervicocranial syndrome: Secondary | ICD-10-CM | POA: Diagnosis not present

## 2021-01-28 DIAGNOSIS — M4725 Other spondylosis with radiculopathy, thoracolumbar region: Secondary | ICD-10-CM | POA: Diagnosis not present

## 2021-01-28 DIAGNOSIS — M4726 Other spondylosis with radiculopathy, lumbar region: Secondary | ICD-10-CM | POA: Diagnosis not present

## 2021-01-28 DIAGNOSIS — M4728 Other spondylosis with radiculopathy, sacral and sacrococcygeal region: Secondary | ICD-10-CM | POA: Diagnosis not present

## 2021-01-29 DIAGNOSIS — M4728 Other spondylosis with radiculopathy, sacral and sacrococcygeal region: Secondary | ICD-10-CM | POA: Diagnosis not present

## 2021-01-29 DIAGNOSIS — M530 Cervicocranial syndrome: Secondary | ICD-10-CM | POA: Diagnosis not present

## 2021-01-29 DIAGNOSIS — M4725 Other spondylosis with radiculopathy, thoracolumbar region: Secondary | ICD-10-CM | POA: Diagnosis not present

## 2021-01-29 DIAGNOSIS — M4726 Other spondylosis with radiculopathy, lumbar region: Secondary | ICD-10-CM | POA: Diagnosis not present

## 2021-02-02 DIAGNOSIS — M4725 Other spondylosis with radiculopathy, thoracolumbar region: Secondary | ICD-10-CM | POA: Diagnosis not present

## 2021-02-02 DIAGNOSIS — M4728 Other spondylosis with radiculopathy, sacral and sacrococcygeal region: Secondary | ICD-10-CM | POA: Diagnosis not present

## 2021-02-02 DIAGNOSIS — M530 Cervicocranial syndrome: Secondary | ICD-10-CM | POA: Diagnosis not present

## 2021-02-02 DIAGNOSIS — M4726 Other spondylosis with radiculopathy, lumbar region: Secondary | ICD-10-CM | POA: Diagnosis not present

## 2021-02-03 DIAGNOSIS — M4725 Other spondylosis with radiculopathy, thoracolumbar region: Secondary | ICD-10-CM | POA: Diagnosis not present

## 2021-02-03 DIAGNOSIS — M4726 Other spondylosis with radiculopathy, lumbar region: Secondary | ICD-10-CM | POA: Diagnosis not present

## 2021-02-03 DIAGNOSIS — M4728 Other spondylosis with radiculopathy, sacral and sacrococcygeal region: Secondary | ICD-10-CM | POA: Diagnosis not present

## 2021-02-03 DIAGNOSIS — M530 Cervicocranial syndrome: Secondary | ICD-10-CM | POA: Diagnosis not present

## 2021-02-09 DIAGNOSIS — M4728 Other spondylosis with radiculopathy, sacral and sacrococcygeal region: Secondary | ICD-10-CM | POA: Diagnosis not present

## 2021-02-09 DIAGNOSIS — M4726 Other spondylosis with radiculopathy, lumbar region: Secondary | ICD-10-CM | POA: Diagnosis not present

## 2021-02-09 DIAGNOSIS — M530 Cervicocranial syndrome: Secondary | ICD-10-CM | POA: Diagnosis not present

## 2021-02-09 DIAGNOSIS — M4725 Other spondylosis with radiculopathy, thoracolumbar region: Secondary | ICD-10-CM | POA: Diagnosis not present

## 2021-02-10 DIAGNOSIS — M4728 Other spondylosis with radiculopathy, sacral and sacrococcygeal region: Secondary | ICD-10-CM | POA: Diagnosis not present

## 2021-02-10 DIAGNOSIS — M4726 Other spondylosis with radiculopathy, lumbar region: Secondary | ICD-10-CM | POA: Diagnosis not present

## 2021-02-10 DIAGNOSIS — M530 Cervicocranial syndrome: Secondary | ICD-10-CM | POA: Diagnosis not present

## 2021-02-10 DIAGNOSIS — M4725 Other spondylosis with radiculopathy, thoracolumbar region: Secondary | ICD-10-CM | POA: Diagnosis not present

## 2021-02-12 DIAGNOSIS — M4726 Other spondylosis with radiculopathy, lumbar region: Secondary | ICD-10-CM | POA: Diagnosis not present

## 2021-02-12 DIAGNOSIS — M4728 Other spondylosis with radiculopathy, sacral and sacrococcygeal region: Secondary | ICD-10-CM | POA: Diagnosis not present

## 2021-02-12 DIAGNOSIS — M530 Cervicocranial syndrome: Secondary | ICD-10-CM | POA: Diagnosis not present

## 2021-02-12 DIAGNOSIS — M4725 Other spondylosis with radiculopathy, thoracolumbar region: Secondary | ICD-10-CM | POA: Diagnosis not present

## 2021-02-18 DIAGNOSIS — M4725 Other spondylosis with radiculopathy, thoracolumbar region: Secondary | ICD-10-CM | POA: Diagnosis not present

## 2021-02-18 DIAGNOSIS — M4728 Other spondylosis with radiculopathy, sacral and sacrococcygeal region: Secondary | ICD-10-CM | POA: Diagnosis not present

## 2021-02-18 DIAGNOSIS — M530 Cervicocranial syndrome: Secondary | ICD-10-CM | POA: Diagnosis not present

## 2021-02-18 DIAGNOSIS — M4726 Other spondylosis with radiculopathy, lumbar region: Secondary | ICD-10-CM | POA: Diagnosis not present

## 2021-02-19 DIAGNOSIS — L578 Other skin changes due to chronic exposure to nonionizing radiation: Secondary | ICD-10-CM | POA: Diagnosis not present

## 2021-02-19 DIAGNOSIS — D225 Melanocytic nevi of trunk: Secondary | ICD-10-CM | POA: Diagnosis not present

## 2021-02-19 DIAGNOSIS — B360 Pityriasis versicolor: Secondary | ICD-10-CM | POA: Diagnosis not present

## 2021-02-23 DIAGNOSIS — M530 Cervicocranial syndrome: Secondary | ICD-10-CM | POA: Diagnosis not present

## 2021-02-23 DIAGNOSIS — M4728 Other spondylosis with radiculopathy, sacral and sacrococcygeal region: Secondary | ICD-10-CM | POA: Diagnosis not present

## 2021-02-23 DIAGNOSIS — M4726 Other spondylosis with radiculopathy, lumbar region: Secondary | ICD-10-CM | POA: Diagnosis not present

## 2021-02-23 DIAGNOSIS — M4725 Other spondylosis with radiculopathy, thoracolumbar region: Secondary | ICD-10-CM | POA: Diagnosis not present

## 2021-02-25 DIAGNOSIS — M4728 Other spondylosis with radiculopathy, sacral and sacrococcygeal region: Secondary | ICD-10-CM | POA: Diagnosis not present

## 2021-02-25 DIAGNOSIS — M530 Cervicocranial syndrome: Secondary | ICD-10-CM | POA: Diagnosis not present

## 2021-02-25 DIAGNOSIS — M4725 Other spondylosis with radiculopathy, thoracolumbar region: Secondary | ICD-10-CM | POA: Diagnosis not present

## 2021-02-25 DIAGNOSIS — M4726 Other spondylosis with radiculopathy, lumbar region: Secondary | ICD-10-CM | POA: Diagnosis not present

## 2021-03-03 DIAGNOSIS — M4725 Other spondylosis with radiculopathy, thoracolumbar region: Secondary | ICD-10-CM | POA: Diagnosis not present

## 2021-03-03 DIAGNOSIS — M530 Cervicocranial syndrome: Secondary | ICD-10-CM | POA: Diagnosis not present

## 2021-03-03 DIAGNOSIS — M4728 Other spondylosis with radiculopathy, sacral and sacrococcygeal region: Secondary | ICD-10-CM | POA: Diagnosis not present

## 2021-03-03 DIAGNOSIS — M4726 Other spondylosis with radiculopathy, lumbar region: Secondary | ICD-10-CM | POA: Diagnosis not present

## 2021-03-10 DIAGNOSIS — M4725 Other spondylosis with radiculopathy, thoracolumbar region: Secondary | ICD-10-CM | POA: Diagnosis not present

## 2021-03-10 DIAGNOSIS — M4728 Other spondylosis with radiculopathy, sacral and sacrococcygeal region: Secondary | ICD-10-CM | POA: Diagnosis not present

## 2021-03-10 DIAGNOSIS — M4726 Other spondylosis with radiculopathy, lumbar region: Secondary | ICD-10-CM | POA: Diagnosis not present

## 2021-03-10 DIAGNOSIS — M530 Cervicocranial syndrome: Secondary | ICD-10-CM | POA: Diagnosis not present

## 2021-03-16 DIAGNOSIS — M530 Cervicocranial syndrome: Secondary | ICD-10-CM | POA: Diagnosis not present

## 2021-03-16 DIAGNOSIS — M4726 Other spondylosis with radiculopathy, lumbar region: Secondary | ICD-10-CM | POA: Diagnosis not present

## 2021-03-16 DIAGNOSIS — M4728 Other spondylosis with radiculopathy, sacral and sacrococcygeal region: Secondary | ICD-10-CM | POA: Diagnosis not present

## 2021-03-16 DIAGNOSIS — M4725 Other spondylosis with radiculopathy, thoracolumbar region: Secondary | ICD-10-CM | POA: Diagnosis not present

## 2021-03-23 DIAGNOSIS — M4726 Other spondylosis with radiculopathy, lumbar region: Secondary | ICD-10-CM | POA: Diagnosis not present

## 2021-03-23 DIAGNOSIS — M4725 Other spondylosis with radiculopathy, thoracolumbar region: Secondary | ICD-10-CM | POA: Diagnosis not present

## 2021-03-23 DIAGNOSIS — M4728 Other spondylosis with radiculopathy, sacral and sacrococcygeal region: Secondary | ICD-10-CM | POA: Diagnosis not present

## 2021-03-23 DIAGNOSIS — M530 Cervicocranial syndrome: Secondary | ICD-10-CM | POA: Diagnosis not present

## 2021-04-01 DIAGNOSIS — M4726 Other spondylosis with radiculopathy, lumbar region: Secondary | ICD-10-CM | POA: Diagnosis not present

## 2021-04-01 DIAGNOSIS — M4728 Other spondylosis with radiculopathy, sacral and sacrococcygeal region: Secondary | ICD-10-CM | POA: Diagnosis not present

## 2021-04-01 DIAGNOSIS — M530 Cervicocranial syndrome: Secondary | ICD-10-CM | POA: Diagnosis not present

## 2021-04-01 DIAGNOSIS — M4725 Other spondylosis with radiculopathy, thoracolumbar region: Secondary | ICD-10-CM | POA: Diagnosis not present

## 2021-04-08 ENCOUNTER — Ambulatory Visit: Payer: BC Managed Care – PPO | Admitting: Physician Assistant

## 2021-04-13 DIAGNOSIS — M4726 Other spondylosis with radiculopathy, lumbar region: Secondary | ICD-10-CM | POA: Diagnosis not present

## 2021-04-13 DIAGNOSIS — M4725 Other spondylosis with radiculopathy, thoracolumbar region: Secondary | ICD-10-CM | POA: Diagnosis not present

## 2021-04-13 DIAGNOSIS — M530 Cervicocranial syndrome: Secondary | ICD-10-CM | POA: Diagnosis not present

## 2021-04-13 DIAGNOSIS — M4728 Other spondylosis with radiculopathy, sacral and sacrococcygeal region: Secondary | ICD-10-CM | POA: Diagnosis not present

## 2021-04-20 DIAGNOSIS — M4728 Other spondylosis with radiculopathy, sacral and sacrococcygeal region: Secondary | ICD-10-CM | POA: Diagnosis not present

## 2021-04-20 DIAGNOSIS — M4726 Other spondylosis with radiculopathy, lumbar region: Secondary | ICD-10-CM | POA: Diagnosis not present

## 2021-04-20 DIAGNOSIS — M530 Cervicocranial syndrome: Secondary | ICD-10-CM | POA: Diagnosis not present

## 2021-04-20 DIAGNOSIS — M4725 Other spondylosis with radiculopathy, thoracolumbar region: Secondary | ICD-10-CM | POA: Diagnosis not present

## 2021-04-27 DIAGNOSIS — M4726 Other spondylosis with radiculopathy, lumbar region: Secondary | ICD-10-CM | POA: Diagnosis not present

## 2021-04-27 DIAGNOSIS — M530 Cervicocranial syndrome: Secondary | ICD-10-CM | POA: Diagnosis not present

## 2021-04-27 DIAGNOSIS — M4728 Other spondylosis with radiculopathy, sacral and sacrococcygeal region: Secondary | ICD-10-CM | POA: Diagnosis not present

## 2021-04-27 DIAGNOSIS — M4725 Other spondylosis with radiculopathy, thoracolumbar region: Secondary | ICD-10-CM | POA: Diagnosis not present

## 2021-05-07 DIAGNOSIS — M4728 Other spondylosis with radiculopathy, sacral and sacrococcygeal region: Secondary | ICD-10-CM | POA: Diagnosis not present

## 2021-05-07 DIAGNOSIS — M4725 Other spondylosis with radiculopathy, thoracolumbar region: Secondary | ICD-10-CM | POA: Diagnosis not present

## 2021-05-07 DIAGNOSIS — M4726 Other spondylosis with radiculopathy, lumbar region: Secondary | ICD-10-CM | POA: Diagnosis not present

## 2021-05-07 DIAGNOSIS — M530 Cervicocranial syndrome: Secondary | ICD-10-CM | POA: Diagnosis not present

## 2021-05-13 DIAGNOSIS — M530 Cervicocranial syndrome: Secondary | ICD-10-CM | POA: Diagnosis not present

## 2021-05-13 DIAGNOSIS — M4726 Other spondylosis with radiculopathy, lumbar region: Secondary | ICD-10-CM | POA: Diagnosis not present

## 2021-05-13 DIAGNOSIS — M4728 Other spondylosis with radiculopathy, sacral and sacrococcygeal region: Secondary | ICD-10-CM | POA: Diagnosis not present

## 2021-05-13 DIAGNOSIS — M4725 Other spondylosis with radiculopathy, thoracolumbar region: Secondary | ICD-10-CM | POA: Diagnosis not present

## 2021-05-25 ENCOUNTER — Encounter: Payer: Self-pay | Admitting: Physician Assistant

## 2021-05-27 DIAGNOSIS — M4728 Other spondylosis with radiculopathy, sacral and sacrococcygeal region: Secondary | ICD-10-CM | POA: Diagnosis not present

## 2021-05-27 DIAGNOSIS — M530 Cervicocranial syndrome: Secondary | ICD-10-CM | POA: Diagnosis not present

## 2021-05-27 DIAGNOSIS — M4725 Other spondylosis with radiculopathy, thoracolumbar region: Secondary | ICD-10-CM | POA: Diagnosis not present

## 2021-05-27 DIAGNOSIS — M4726 Other spondylosis with radiculopathy, lumbar region: Secondary | ICD-10-CM | POA: Diagnosis not present

## 2021-06-10 DIAGNOSIS — M4725 Other spondylosis with radiculopathy, thoracolumbar region: Secondary | ICD-10-CM | POA: Diagnosis not present

## 2021-06-10 DIAGNOSIS — M4726 Other spondylosis with radiculopathy, lumbar region: Secondary | ICD-10-CM | POA: Diagnosis not present

## 2021-06-10 DIAGNOSIS — M530 Cervicocranial syndrome: Secondary | ICD-10-CM | POA: Diagnosis not present

## 2021-06-10 DIAGNOSIS — M4728 Other spondylosis with radiculopathy, sacral and sacrococcygeal region: Secondary | ICD-10-CM | POA: Diagnosis not present

## 2021-06-14 ENCOUNTER — Encounter: Payer: Self-pay | Admitting: Physician Assistant

## 2021-06-17 DIAGNOSIS — M4728 Other spondylosis with radiculopathy, sacral and sacrococcygeal region: Secondary | ICD-10-CM | POA: Diagnosis not present

## 2021-06-17 DIAGNOSIS — M530 Cervicocranial syndrome: Secondary | ICD-10-CM | POA: Diagnosis not present

## 2021-06-17 DIAGNOSIS — M4726 Other spondylosis with radiculopathy, lumbar region: Secondary | ICD-10-CM | POA: Diagnosis not present

## 2021-06-17 DIAGNOSIS — M4725 Other spondylosis with radiculopathy, thoracolumbar region: Secondary | ICD-10-CM | POA: Diagnosis not present

## 2021-07-01 DIAGNOSIS — M545 Low back pain, unspecified: Secondary | ICD-10-CM | POA: Diagnosis not present

## 2021-07-01 DIAGNOSIS — M542 Cervicalgia: Secondary | ICD-10-CM | POA: Diagnosis not present

## 2021-07-01 DIAGNOSIS — G8911 Acute pain due to trauma: Secondary | ICD-10-CM | POA: Diagnosis not present

## 2021-07-22 DIAGNOSIS — L732 Hidradenitis suppurativa: Secondary | ICD-10-CM | POA: Diagnosis not present

## 2021-09-11 DIAGNOSIS — N61 Mastitis without abscess: Secondary | ICD-10-CM | POA: Diagnosis not present

## 2021-10-13 DIAGNOSIS — F4320 Adjustment disorder, unspecified: Secondary | ICD-10-CM | POA: Diagnosis not present

## 2021-10-22 DIAGNOSIS — Z9049 Acquired absence of other specified parts of digestive tract: Secondary | ICD-10-CM | POA: Diagnosis not present

## 2021-10-22 DIAGNOSIS — Z87891 Personal history of nicotine dependence: Secondary | ICD-10-CM | POA: Diagnosis not present

## 2021-10-22 DIAGNOSIS — Z20822 Contact with and (suspected) exposure to covid-19: Secondary | ICD-10-CM | POA: Diagnosis not present

## 2021-10-22 DIAGNOSIS — R112 Nausea with vomiting, unspecified: Secondary | ICD-10-CM | POA: Diagnosis not present

## 2021-10-22 DIAGNOSIS — R1011 Right upper quadrant pain: Secondary | ICD-10-CM | POA: Diagnosis not present

## 2021-10-22 DIAGNOSIS — R1012 Left upper quadrant pain: Secondary | ICD-10-CM | POA: Diagnosis not present

## 2021-10-22 DIAGNOSIS — R0789 Other chest pain: Secondary | ICD-10-CM | POA: Diagnosis not present

## 2021-10-22 DIAGNOSIS — Z79899 Other long term (current) drug therapy: Secondary | ICD-10-CM | POA: Diagnosis not present

## 2021-10-22 DIAGNOSIS — K5730 Diverticulosis of large intestine without perforation or abscess without bleeding: Secondary | ICD-10-CM | POA: Diagnosis not present

## 2021-10-22 DIAGNOSIS — N3289 Other specified disorders of bladder: Secondary | ICD-10-CM | POA: Diagnosis not present

## 2021-10-22 DIAGNOSIS — R079 Chest pain, unspecified: Secondary | ICD-10-CM | POA: Diagnosis not present

## 2021-10-23 DIAGNOSIS — N3289 Other specified disorders of bladder: Secondary | ICD-10-CM | POA: Diagnosis not present

## 2021-10-23 DIAGNOSIS — Z9049 Acquired absence of other specified parts of digestive tract: Secondary | ICD-10-CM | POA: Diagnosis not present

## 2021-10-23 DIAGNOSIS — K5730 Diverticulosis of large intestine without perforation or abscess without bleeding: Secondary | ICD-10-CM | POA: Diagnosis not present

## 2021-10-25 DIAGNOSIS — R109 Unspecified abdominal pain: Secondary | ICD-10-CM | POA: Diagnosis not present

## 2021-11-16 DIAGNOSIS — F4320 Adjustment disorder, unspecified: Secondary | ICD-10-CM | POA: Diagnosis not present

## 2021-11-23 DIAGNOSIS — F4320 Adjustment disorder, unspecified: Secondary | ICD-10-CM | POA: Diagnosis not present

## 2021-11-30 DIAGNOSIS — F4320 Adjustment disorder, unspecified: Secondary | ICD-10-CM | POA: Diagnosis not present

## 2021-12-07 DIAGNOSIS — F4320 Adjustment disorder, unspecified: Secondary | ICD-10-CM | POA: Diagnosis not present

## 2021-12-14 DIAGNOSIS — F4320 Adjustment disorder, unspecified: Secondary | ICD-10-CM | POA: Diagnosis not present

## 2021-12-16 DIAGNOSIS — B349 Viral infection, unspecified: Secondary | ICD-10-CM | POA: Diagnosis not present

## 2021-12-23 DIAGNOSIS — L509 Urticaria, unspecified: Secondary | ICD-10-CM | POA: Diagnosis not present

## 2021-12-23 DIAGNOSIS — T7840XD Allergy, unspecified, subsequent encounter: Secondary | ICD-10-CM | POA: Diagnosis not present

## 2021-12-25 ENCOUNTER — Encounter: Payer: Self-pay | Admitting: Internal Medicine

## 2021-12-25 ENCOUNTER — Ambulatory Visit (INDEPENDENT_AMBULATORY_CARE_PROVIDER_SITE_OTHER): Payer: BC Managed Care – PPO | Admitting: Internal Medicine

## 2021-12-25 VITALS — BP 110/68 | HR 88 | Temp 99.5°F | Resp 16 | Ht 61.0 in | Wt 180.8 lb

## 2021-12-25 DIAGNOSIS — L501 Idiopathic urticaria: Secondary | ICD-10-CM | POA: Diagnosis not present

## 2021-12-25 NOTE — Progress Notes (Signed)
New Patient Note  RE: Cynthia Christensen MRN: [id number] DOB: April 19, 1985 Date of Office Visit: 12/25/2021  Consult requested by: Donella Stade, PA-C Primary care provider: Donella Stade, PA-C  Chief Complaint: Urticaria and Angioedema  History of Present Illness: I had the pleasure of seeing Cynthia Christensen for initial evaluation at the [facility] on 12/25/2021. She is a 36 y.o. female, who is referred here by Donella Stade, PA-C for the evaluation of urticaria, angioedema .  History obtained from patient, chart review.  On the 12/16/21 she was working in her home office and she took a nap and woke with hand swelling and erythematous papules on hands.  She presented to ED and diagnosed with atypical   On Monday morning she woke up with lip and tongue swelling. She presented to UC on Tuesday 11/2921  She was given prednisone, benadryl and zyrtec daily.    Some symptoms may be associated with red meat ingestion.  She did find a lone star tick on her body in June.    Previous allergy testing was positive to cladisporium, ragweed, mulberry,   Assessment and Plan: Cynthia Christensen is a 36 y.o. female with: Idiopathic urticaria - Plan: Chronic Urticaria, CBC with Differential, Comprehensive metabolic panel, Sed Rate (ESR), C-reactive protein, Thyroid Panel With TSH, Tryptase, Alpha-Gal Panel Plan: Patient Instructions  Chronic Idiopathic Urticaria: - this is defined as hives lasting more than 6 weeks without an identifiable trigger - hives can be from a number of different sources including infections, allergies, vibration, temperature, pressure among many others other possible causes - often an identifiable cause is not determined - some potential triggers include: stress, illness, NSAIDs, aspirin, hormonal changes - you do not have any red flag symptoms to make [place] concerned about secondary causes of hives, but we will screen for these for reassurance with: CBC w diff, CMP,  tryptase, TSH, hive panel, alpha-gal panel, inflammatory markers - approximately 50% of patients with chronic hives can have some associated swelling of the face/lips/eyelids (this is not a cause for alarm and does not typically progress onto systemic allergic reactions)  Therapy Plan:  - start Allegra 144m twice a day   -generic is okay  - if hives are uncontrolled, increase dose of Allegra to max dose of 2 pills  twice daily - this is maximum dose - Start Pepcid 254mtwice daily  - can increase or decrease dosing depending on symptom control to a maximum dose of 4 tablets of antihistamine daily. Wait until hives free for at least one month prior to decreasing dose.   - if hives are still uncontrolled with the above regimen, please arrange an appointment for discussion of Xolair (omalizumab)- an injectable medication for hives  Can use one of the following in place of zyrtec if desires: Claritin (loratadine) 10 mg, Xyzal (levocetirizine) 5 mg or Zyrtec (10) mg daily as needed     No orders of the defined types were placed in this encounter.  Lab Orders         Chronic Urticaria         CBC with Differential         Comprehensive metabolic panel         Sed Rate (ESR)         C-reactive protein         Thyroid Panel With TSH         Tryptase         Alpha-Gal  Panel      Other allergy screening: Asthma: no Rhino conjunctivitis: no Food allergy: no Medication allergy: no Hymenoptera allergy: no Urticaria: yes Eczema:no History of recurrent infections suggestive of immunodeficency: no  Diagnostics: None Done    Past Medical History: Patient Active Problem List   Diagnosis Date Noted   Status post cholecystectomy 07/12/2018   Witnessed episode of apnea 07/12/2018   Class 1 obesity due to excess calories without serious comorbidity with body mass index (BMI) of 32.0 to 32.9 in adult 07/12/2018   Non-restorative sleep 07/12/2018   Snoring 07/12/2018   Lip swelling  09/19/2015   Tongue swelling 09/19/2015   Allergic reaction to food 09/19/2015   Compound nevus 06/27/2015   Personal history of other specified conditions 08/07/2012   Acne inversa 08/07/2012   Hirsutism 05/22/2012   Hyperlipidemia 05/22/2012   Candidal skin infection 05/03/2012   Tinea versicolor 05/03/2012   Tobacco use 05/03/2012   Past Medical History:  Diagnosis Date   History of HPV infection    Tobacco use 05/03/2012   Urticaria    Past Surgical History: Past Surgical History:  Procedure Laterality Date   CESAREAN SECTION     CHOLECYSTECTOMY     HERNIA REPAIR     Medication List:  Current Outpatient Medications  Medication Sig Dispense Refill   albuterol (PROVENTIL HFA;VENTOLIN HFA) 108 (90 Base) MCG/ACT inhaler Inhale 2 puffs into the lungs every 6 (six) hours as needed for wheezing or shortness of breath. 1 Inhaler 1   cetirizine (ZYRTEC) 10 MG tablet Take 10 mg by mouth daily.     diphenhydrAMINE (SOMINEX) 25 MG tablet Take 25 mg by mouth at bedtime as needed for sleep.     hydrocortisone cream 1 % Apply 1 Application topically 2 (two) times daily.     predniSONE (DELTASONE) 5 MG tablet Take by mouth.     No current facility-administered medications for this visit.   Allergies: No Known Allergies Social History: Social History   Socioeconomic History   Marital status: Married    Spouse name: Not on file   Number of children: Not on file   Years of education: Not on file   Highest education level: Not on file  Occupational History   Not on file  Tobacco Use   Smoking status: Former    Packs/day: 0.50    Years: 5.00    Total pack years: 2.50    Types: Cigarettes    Start date: 08/28/2018   Smokeless tobacco: Never  Vaping Use   Vaping Use: Unknown  Substance and Sexual Activity   Alcohol use: Yes   Drug use: Yes   Sexual activity: Yes    Birth control/protection: None  Other Topics Concern   Not on file  Social History Narrative   Not on file    Social Determinants of Health   Financial Resource Strain: Not on file  Food Insecurity: Not on file  Transportation Needs: Not on file  Physical Activity: Not on file  Stress: Not on file  Social Connections: Not on file   Lives in a single-family home that is 37 years old.  There are no roaches in the house and bed is 2 feet off the floor.  There are dust mite precautions on bed but not pillows.  She is not exposed to fumes, chemicals or dust.  There is a HEPA filter in the home and home is not near an interstate or industrial area. Smoking: She occasionally vapes Occupation: Works  as a Administrator, [employer] for the past 11 years  Environmental History: Environmental education officer in the house: no Charity fundraiser in the family room: no Carpet in the bedroom: no Heating: heat pump Cooling: heat pump Pet: yes 1 dog and 1 cat with access to bedroom  Family History: Family History  Problem Relation Age of Onset   Lung cancer Other        grandmother   Heart attack Other        grandfather   Thyroid disease Mother      ROS: All others negative except as noted per HPI.   Objective: BP 110/68   Pulse 88   Temp 99.5 F (37.5 C) (Temporal)   Resp 16   Ht _0  (1.549 m)   Wt 180 lb 12.8 oz (82 kg)   SpO2 99%   BMI 34.16 kg/m  Body mass index is 34.16 kg/m.  General Appearance:  Alert, cooperative, no distress, appears stated age  Head:  Normocephalic, without obvious abnormality, atraumatic  Eyes:  Conjunctiva clear, EOM's intact  Nose: Nares normal,   Throat: Lips, tongue normal; teeth and gums normal,   Neck: Supple, symmetrical  Lungs:   , Respirations unlabored, no coughing  Heart:  , Appears well perfused  Extremities: No edema  Skin: Skin color, texture, turgor normal,  erythematous papules on hands and necks   Neurologic: No gross deficits   The plan was reviewed with the patient/family, and all questions/concerned were addressed.  It was my pleasure to see Cynthia Christensen  today and participate in her care. Please feel free to contact me with any questions or concerns.  Sincerely,  Roney Marion, MD Allergy & Immunology  [facility] office: [phone] [facility] office: [phone]

## 2021-12-25 NOTE — Patient Instructions (Signed)
Chronic Idiopathic Urticaria: - this is defined as hives lasting more than 6 weeks without an identifiable trigger - hives can be from a number of different sources including infections, allergies, vibration, temperature, pressure among many others other possible causes - often an identifiable cause is not determined - some potential triggers include: stress, illness, NSAIDs, aspirin, hormonal changes - you do not have any red flag symptoms to make [place] concerned about secondary causes of hives, but we will screen for these for reassurance with: CBC w diff, CMP, tryptase, TSH, hive panel, alpha-gal panel, inflammatory markers - approximately 50% of patients with chronic hives can have some associated swelling of the face/lips/eyelids (this is not a cause for alarm and does not typically progress onto systemic allergic reactions)  Therapy Plan:  - start Allegra '180mg'$  twice a day   -generic is okay  - if hives are uncontrolled, increase dose of Allegra to max dose of 2 pills  twice daily - this is maximum dose - Start Pepcid '20mg'$  twice daily  - can increase or decrease dosing depending on symptom control to a maximum dose of 4 tablets of antihistamine daily. Wait until hives free for at least one month prior to decreasing dose.   - if hives are still uncontrolled with the above regimen, please arrange an appointment for discussion of Xolair (omalizumab)- an injectable medication for hives  Can use one of the following in place of zyrtec if desires: Claritin (loratadine) 10 mg, Xyzal (levocetirizine) 5 mg or Zyrtec (10) mg daily as needed

## 2021-12-28 ENCOUNTER — Encounter: Payer: Self-pay | Admitting: Internal Medicine

## 2021-12-28 DIAGNOSIS — F4320 Adjustment disorder, unspecified: Secondary | ICD-10-CM | POA: Diagnosis not present

## 2022-01-01 LAB — CBC WITH DIFFERENTIAL/PLATELET
Basophils Absolute: 0 10*3/uL (ref 0.0–0.2)
Basos: 1 %
EOS (ABSOLUTE): 0 10*3/uL (ref 0.0–0.4)
Eos: 1 %
Hematocrit: 33 % — ABNORMAL LOW (ref 34.0–46.6)
Hemoglobin: 11.5 g/dL (ref 11.1–15.9)
Immature Grans (Abs): 0 10*3/uL (ref 0.0–0.1)
Immature Granulocytes: 0 %
Lymphocytes Absolute: 1.8 10*3/uL (ref 0.7–3.1)
Lymphs: 41 %
MCH: 31.9 pg (ref 26.6–33.0)
MCHC: 34.8 g/dL (ref 31.5–35.7)
MCV: 92 fL (ref 79–97)
Monocytes Absolute: 0.4 10*3/uL (ref 0.1–0.9)
Monocytes: 9 %
Neutrophils Absolute: 2.1 10*3/uL (ref 1.4–7.0)
Neutrophils: 48 %
Platelets: 221 10*3/uL (ref 150–450)
RBC: 3.6 x10E6/uL — ABNORMAL LOW (ref 3.77–5.28)
RDW: 12 % (ref 11.7–15.4)
WBC: 4.4 10*3/uL (ref 3.4–10.8)

## 2022-01-01 LAB — COMPREHENSIVE METABOLIC PANEL
ALT: 24 IU/L (ref 0–32)
AST: 15 IU/L (ref 0–40)
Albumin/Globulin Ratio: 2 (ref 1.2–2.2)
Albumin: 4.1 g/dL (ref 3.9–4.9)
Alkaline Phosphatase: 104 IU/L (ref 44–121)
BUN/Creatinine Ratio: 31 — ABNORMAL HIGH (ref 9–23)
BUN: 18 mg/dL (ref 6–20)
Bilirubin Total: 0.5 mg/dL (ref 0.0–1.2)
CO2: 20 mmol/L (ref 20–29)
Calcium: 8.8 mg/dL (ref 8.7–10.2)
Chloride: 103 mmol/L (ref 96–106)
Creatinine, Ser: 0.58 mg/dL (ref 0.57–1.00)
Globulin, Total: 2.1 g/dL (ref 1.5–4.5)
Glucose: 99 mg/dL (ref 70–99)
Potassium: 3.9 mmol/L (ref 3.5–5.2)
Sodium: 140 mmol/L (ref 134–144)
Total Protein: 6.2 g/dL (ref 6.0–8.5)
eGFR: 119 mL/min/{1.73_m2} (ref >59)

## 2022-01-01 LAB — ALPHA-GAL PANEL
Allergen Lamb IgE: <0.1 kU/L
Beef IgE: <0.1 kU/L
IgE (Immunoglobulin E), Serum: 9 IU/mL (ref 6–495)
O215-IgE Alpha-Gal: <0.1 kU/L
Pork IgE: <0.1 kU/L

## 2022-01-01 LAB — TRYPTASE: Tryptase: 7.6 ug/L (ref 2.2–13.2)

## 2022-01-01 LAB — THYROID PANEL WITH TSH
Free Thyroxine Index: 1.5 (ref 1.2–4.9)
T3 Uptake Ratio: 21 % — ABNORMAL LOW (ref 24–39)
T4, Total: 7.1 ug/dL (ref 4.5–12.0)
TSH: 1.99 u[IU]/mL (ref 0.450–4.500)

## 2022-01-01 LAB — SEDIMENTATION RATE: Sed Rate: 2 mm/hr (ref 0–32)

## 2022-01-01 LAB — CHRONIC URTICARIA: cu index: 32 — ABNORMAL HIGH (ref <10)

## 2022-01-01 LAB — C-REACTIVE PROTEIN: CRP: 11 mg/L — ABNORMAL HIGH (ref 0–10)

## 2022-01-04 DIAGNOSIS — F4320 Adjustment disorder, unspecified: Secondary | ICD-10-CM | POA: Diagnosis not present

## 2022-01-05 NOTE — Progress Notes (Signed)
I reviewed the bloodwork.  chronic urticaria index (checks for autoantibodies that trigger mast cells) and inflammatory markers were elevated.  This can be seen with autoimmune subtype of chronic hives.  She should follow-up with me if the antihistamines are not working for evaluation of Xolair.  Blood count, kidney function, liver function, electrolytes, thyroid, autoimmune screener, tryptase (checks for mast cell issues) and alpha gal (checks for red meat allergy) were all normal which is great.   Can someone let patient know?

## 2022-01-11 DIAGNOSIS — F4320 Adjustment disorder, unspecified: Secondary | ICD-10-CM | POA: Diagnosis not present

## 2022-01-13 ENCOUNTER — Telehealth: Payer: Self-pay | Admitting: Internal Medicine

## 2022-01-13 DIAGNOSIS — Z1329 Encounter for screening for other suspected endocrine disorder: Secondary | ICD-10-CM | POA: Diagnosis not present

## 2022-01-13 DIAGNOSIS — Z01411 Encounter for gynecological examination (general) (routine) with abnormal findings: Secondary | ICD-10-CM | POA: Diagnosis not present

## 2022-01-13 DIAGNOSIS — Z8349 Family history of other endocrine, nutritional and metabolic diseases: Secondary | ICD-10-CM | POA: Diagnosis not present

## 2022-01-13 DIAGNOSIS — Z8632 Personal history of gestational diabetes: Secondary | ICD-10-CM | POA: Diagnosis not present

## 2022-01-13 NOTE — Telephone Encounter (Signed)
Called patient back to give lab results but no answer. Left voicemail message for patient to call our office at her earliest convenience.

## 2022-01-13 NOTE — Telephone Encounter (Signed)
Patient is calling in for lab results please advise

## 2022-01-13 NOTE — Telephone Encounter (Signed)
Pt returning call about lab results. 

## 2022-01-14 DIAGNOSIS — E669 Obesity, unspecified: Secondary | ICD-10-CM | POA: Diagnosis not present

## 2022-01-14 DIAGNOSIS — Z8742 Personal history of other diseases of the female genital tract: Secondary | ICD-10-CM | POA: Diagnosis not present

## 2022-01-14 DIAGNOSIS — N920 Excessive and frequent menstruation with regular cycle: Secondary | ICD-10-CM | POA: Diagnosis not present

## 2022-01-14 DIAGNOSIS — Z98891 History of uterine scar from previous surgery: Secondary | ICD-10-CM | POA: Diagnosis not present

## 2022-01-21 NOTE — Telephone Encounter (Signed)
Left message for patient to look in her my chart to read her lab results and I will call her to discuss her lab results and if she has any questions.

## 2022-01-27 DIAGNOSIS — M62830 Muscle spasm of back: Secondary | ICD-10-CM | POA: Diagnosis not present

## 2022-01-27 DIAGNOSIS — M9901 Segmental and somatic dysfunction of cervical region: Secondary | ICD-10-CM | POA: Diagnosis not present

## 2022-01-27 DIAGNOSIS — M546 Pain in thoracic spine: Secondary | ICD-10-CM | POA: Diagnosis not present

## 2022-01-27 DIAGNOSIS — S134XXA Sprain of ligaments of cervical spine, initial encounter: Secondary | ICD-10-CM | POA: Diagnosis not present

## 2022-01-28 ENCOUNTER — Telehealth: Payer: Self-pay

## 2022-01-28 NOTE — Telephone Encounter (Signed)
Lab results verbally given to patient. Patient verbally understood. I discuss with patient regarding her lab that showed positive for chronic urticaria that her hives are going to come and go for no apparent reason. We call that waxing or waning. The patient stated that the allegra 180 mg she took four times a day and pepcid 20 mg she took twice daily which really didn't help. The hives just resolved so she stopped all meds. She would like to take the xolair injection if the hives return. If the hives return can we see her as a work in and give [employer]. Otherwise the patient doesn't need a return appointment correct.

## 2022-02-01 DIAGNOSIS — F4320 Adjustment disorder, unspecified: Secondary | ICD-10-CM | POA: Diagnosis not present

## 2022-02-02 NOTE — Telephone Encounter (Signed)
Refer to previous note.

## 2022-02-03 NOTE — Telephone Encounter (Signed)
Lets get her scheduled with me either Friday in [place] or Monday in [place] and we can start xolair.  Thanks!

## 2022-02-03 NOTE — Telephone Encounter (Signed)
Dr. Edison Pace said patient can come in today to receive xolair for her hives. Cynthia Christensen declined because her hives are resolved at this time, but she will call us if hives return so she will come into the office to receive xolair injections 300 mg per Dr. Edison Pace.

## 2022-02-10 DIAGNOSIS — M546 Pain in thoracic spine: Secondary | ICD-10-CM | POA: Diagnosis not present

## 2022-02-10 DIAGNOSIS — M62830 Muscle spasm of back: Secondary | ICD-10-CM | POA: Diagnosis not present

## 2022-02-10 DIAGNOSIS — S134XXA Sprain of ligaments of cervical spine, initial encounter: Secondary | ICD-10-CM | POA: Diagnosis not present

## 2022-02-10 DIAGNOSIS — M9901 Segmental and somatic dysfunction of cervical region: Secondary | ICD-10-CM | POA: Diagnosis not present

## 2022-02-15 DIAGNOSIS — F4320 Adjustment disorder, unspecified: Secondary | ICD-10-CM | POA: Diagnosis not present

## 2022-02-19 ENCOUNTER — Telehealth: Payer: BC Managed Care – PPO | Admitting: Nurse Practitioner

## 2022-02-19 DIAGNOSIS — R030 Elevated blood-pressure reading, without diagnosis of hypertension: Secondary | ICD-10-CM

## 2022-02-19 NOTE — Progress Notes (Signed)
Virtual Visit Consent   Cynthia Christensen, you are scheduled for a virtual visit with a [facility] provider today. Just as with appointments in the office, your consent must be obtained to participate. Your consent will be active for this visit and any virtual visit you may have with one of our providers in the next 365 days. If you have a MyChart account, a copy of this consent can be sent to you electronically.  As this is a virtual visit, video technology does not allow for your provider to perform a traditional examination. This may limit your provider's ability to fully assess your condition. If your provider identifies any concerns that need to be evaluated in person or the need to arrange testing (such as labs, EKG, etc.), we will make arrangements to do so. Although advances in technology are sophisticated, we cannot ensure that it will always work on either your end or our end. If the connection with a video visit is poor, the visit may have to be switched to a telephone visit. With either a video or telephone visit, we are not always able to ensure that we have a secure connection.  By engaging in this virtual visit, you consent to the provision of healthcare and authorize for your insurance to be billed (if applicable) for the services provided during this visit. Depending on your insurance coverage, you may receive a charge related to this service.  I need to obtain your verbal consent now. Are you willing to proceed with your visit today? Cynthia Christensen has provided verbal consent on 02/19/2022 for a virtual visit (video or telephone). Cynthia Schneiders, FNP  Date: 02/19/2022 4:30 PM  Virtual Visit via Video Note   I, Cynthia Christensen, connected with  Cynthia Christensen  ([id number], [birth year]) on 02/19/22 at  4:45 PM EST by a video-enabled telemedicine application and verified that I am speaking with the correct person using two identifiers.  Location: Patient: Virtual Visit Location Patient: Home Provider:  Virtual Visit Location Provider: Home Office   I discussed the limitations of evaluation and management by telemedicine and the availability of in person appointments. The patient expressed understanding and agreed to proceed.    History of Present Illness: Cynthia Christensen is a 37 y.o. who identifies as a female who was assigned female at birth, and is being seen today for new onset elevated BP and HA   She has noted recently that she has been getting headaches and when she bends forward she feels a pulsation in her head.  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 She was OK 12 weeks after but feels these headaches are similar to her post partem HTN Problems:  Patient Active Problem List   Diagnosis Date Noted   Status post cholecystectomy 07/12/2018   Witnessed episode of apnea 07/12/2018   Class 1 obesity due to excess calories without serious comorbidity with body mass index (BMI) of 32.0 to 32.9 in adult 07/12/2018   Non-restorative sleep 07/12/2018   Snoring 07/12/2018   Lip swelling 09/19/2015   Tongue swelling 09/19/2015   Allergic reaction to food 09/19/2015  Compound nevus 06/27/2015   Personal history of other specified conditions 08/07/2012   Acne inversa 08/07/2012   Hirsutism 05/22/2012   Hyperlipidemia 05/22/2012   Candidal skin infection 05/03/2012   Tinea versicolor 05/03/2012   Tobacco use 05/03/2012    Allergies: No Known Allergies Medications:  Current Outpatient Medications:    albuterol (PROVENTIL HFA;VENTOLIN HFA) 108 (90 Base) MCG/ACT inhaler, Inhale 2 puffs into the lungs every 6 (six)  hours as needed for wheezing or shortness of breath., Disp: 1 Inhaler, Rfl: 1   cetirizine (ZYRTEC) 10 MG tablet, Take 10 mg by mouth daily., Disp: , Rfl:    diphenhydrAMINE (SOMINEX) 25 MG tablet, Take 25 mg by mouth at bedtime as needed for sleep., Disp: , Rfl:    hydrocortisone cream 1 %, Apply 1 Application topically 2 (two) times daily., Disp: , Rfl:    predniSONE (DELTASONE) 5 MG tablet, Take by mouth., Disp: , Rfl:   Observations/Objective: Patient is well-developed, well-nourished in no acute distress.  Resting comfortably  at home.  Head is normocephalic, atraumatic.  No labored breathing.  Speech is clear and coherent with logical content.  Patient is alert and oriented at baseline.    Assessment and Plan: 1. Elevated blood pressure reading    Advised stopping OCP for the weekend.  Follow up with OB Monday- advised sending Mychart message today and call for apt Monday   Continue to monitor BP & discussed reason for emergent follow up BP 160/100 or over, 10/10 HA visual changes etc  Discussed taking BP while sitting, legs uncrossed, not directly after caffeine   Tylenol for HA control   Follow Up Instructions: I discussed the assessment and treatment plan with the patient. The patient was provided an opportunity to ask questions and all were answered. The patient agreed with the plan and demonstrated an understanding of the instructions.  A copy of instructions were sent to the patient via MyChart unless otherwise noted below.    The patient was advised to call back or seek an in-person evaluation if the symptoms worsen or if the condition fails to improve as anticipated.  Time:  I spent 15 minutes with the patient via telehealth technology discussing the above problems/concerns.    Cynthia Schneiders, FNP

## 2022-02-22 DIAGNOSIS — F4320 Adjustment disorder, unspecified: Secondary | ICD-10-CM | POA: Diagnosis not present

## 2022-02-23 ENCOUNTER — Ambulatory Visit (INDEPENDENT_AMBULATORY_CARE_PROVIDER_SITE_OTHER): Payer: BC Managed Care – PPO | Admitting: Physician Assistant

## 2022-02-23 VITALS — BP 122/86 | HR 83 | Ht 61.0 in | Wt 174.0 lb

## 2022-02-23 DIAGNOSIS — R030 Elevated blood-pressure reading, without diagnosis of hypertension: Secondary | ICD-10-CM

## 2022-02-23 DIAGNOSIS — F419 Anxiety disorder, unspecified: Secondary | ICD-10-CM

## 2022-02-23 DIAGNOSIS — Z13220 Encounter for screening for lipoid disorders: Secondary | ICD-10-CM | POA: Diagnosis not present

## 2022-02-23 MED ORDER — PROPRANOLOL HCL 10 MG PO TABS: ORAL_TABLET | ORAL | 0 refills | Status: AC

## 2022-02-23 NOTE — Progress Notes (Signed)
Acute Office Visit  Subjective:     Patient ID: Cynthia Christensen, female    DOB: 1985/11/18, 37 y.o.   MRN: [id number]  Chief Complaint  Patient presents with   Hypertension    HPI Patient is in today to follow up on blood pressure. In the last 2 weeks patient has had a lot of BP elevation problems. She had a video visit for her BP on 02/19/2022 where it was suspected that maybe her OCP was doing this and she was told to stop. She stopped and BP did get a little better but then began to spike again. She went in person to UC on 02/22/2022. BP was 125/97 but patient as asymptomatic. No medications were started and was told to follow up with PcP. 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 She admits that her anxiety is really bad right now. She feels like this is mostly due to work and stress. She does have a lot of work stress on her. No swelling, SOB, headache, vision changes.  .. Active Ambulatory Problems    Diagnosis Date Noted   Candidal skin infection 05/03/2012   Tinea versicolor 05/03/2012   Tobacco use 05/03/2012   Hirsutism 05/22/2012   Hyperlipidemia 05/22/2012   Personal history of other specified conditions 08/07/2012   Acne inversa 08/07/2012   Compound nevus 06/27/2015   Lip swelling 09/19/2015   Tongue swelling 09/19/2015   Allergic reaction to food 09/19/2015   Status post cholecystectomy 07/12/2018   Witnessed episode of apnea 07/12/2018   Class 1 obesity due to excess calories without serious comorbidity with body mass index (BMI) of 32.0 to 32.9 in adult 07/12/2018   Non-restorative sleep 07/12/2018   Snoring 07/12/2018   Resolved Ambulatory Problems    Diagnosis Date Noted   No Resolved Ambulatory Problems   Past Medical History:  Diagnosis Date   History of HPV infection    Urticaria        ROS See HPI.      Objective:    BP 122/86   Pulse 83   Ht '5\' 1"'$  (1.549 m)   Wt 174 lb (78.9 kg)   SpO2 100%   BMI 32.88 kg/m  BP  Readings from Last 3 Encounters:  02/23/22 122/86  12/25/21 110/68  08/05/20 118/73   Wt Readings from Last 3 Encounters:  02/23/22 174 lb (78.9 kg)  12/25/21 180 lb 12.8 oz (82 kg)  08/05/20 204 lb (92.5 kg)      Physical Exam Constitutional:      Appearance: Normal appearance. She is obese.  HENT:     Head: Normocephalic.  Cardiovascular:     Rate and Rhythm: Normal rate and regular rhythm.     Pulses: Normal pulses.  Pulmonary:     Effort: Pulmonary effort is normal.     Breath sounds: Normal breath sounds.  Musculoskeletal:     Right lower leg: No edema.     Left lower leg: No edema.  Lymphadenopathy:     Cervical: No cervical adenopathy.  Neurological:     General: No focal deficit present.     Mental Status: She is alert and oriented to person, place, and time.  Psychiatric:        Mood and Affect: Mood normal.          Assessment & Plan:  Marland KitchenMarland KitchenTakeyla was seen today for hypertension.  Diagnoses and all orders for this visit:  Elevated blood pressure reading -  propranolol (INDERAL) 10 MG tablet; Take one to two tablets as needed up to three times a day. -     Lipid Panel w/reflex Direct LDL -     BASIC METABOLIC PANEL WITH GFR  Anxiety -     propranolol (INDERAL) 10 MG tablet; Take one to two tablets as needed up to three times a day. -     BASIC METABOLIC PANEL WITH GFR  Screening for lipid disorders -     Lipid Panel w/reflex Direct LDL   BP's appear like they spike with anxiety but pt is symptomatic from the BP spike Vitals look great today Trial of propranolol up to three times a day for BP and anxiety If she finds herself needing this regularly or anxiety not controlled we need to consider anxiety medication.  Hesitant to put her on BP medications due to her low BP readings at times.  Fasting labs ordered today Continue to keep BP log Follow up in 4-6 weeks   Iran Planas, PA-C

## 2022-02-23 NOTE — Progress Notes (Signed)
Home Blood Pressure Readings:  02/22/2022: 110/86 145/82 120/80 113/77 127/86 135/89 137/104  02/21/2022: 140/102 127/100  02/20/2022: 119/85 119/46  02/19/2022: 120/86 112/87 131/96 134/91 132/99  02/18/2022: 122/85 135/94 125/80  02/17/2022: 118/81  02/16/2022: 123/82 135/87 128/84 101/66 142/98 146/94

## 2022-02-23 NOTE — Patient Instructions (Signed)

## 2022-02-24 ENCOUNTER — Encounter: Payer: Self-pay | Admitting: Physician Assistant

## 2022-02-26 DIAGNOSIS — F419 Anxiety disorder, unspecified: Secondary | ICD-10-CM | POA: Diagnosis not present

## 2022-02-26 DIAGNOSIS — R030 Elevated blood-pressure reading, without diagnosis of hypertension: Secondary | ICD-10-CM | POA: Diagnosis not present

## 2022-02-26 DIAGNOSIS — Z13220 Encounter for screening for lipoid disorders: Secondary | ICD-10-CM | POA: Diagnosis not present

## 2022-02-27 LAB — BASIC METABOLIC PANEL WITH GFR
BUN: 9 mg/dL (ref 7–25)
CO2: 28 mmol/L (ref 20–32)
Calcium: 8.9 mg/dL (ref 8.6–10.2)
Chloride: 105 mmol/L (ref 98–110)
Creat: 0.59 mg/dL (ref 0.50–0.97)
Glucose, Bld: 88 mg/dL (ref 65–139)
Potassium: 4.3 mmol/L (ref 3.5–5.3)
Sodium: 139 mmol/L (ref 135–146)
eGFR: 119 mL/min/{1.73_m2} (ref >=60)

## 2022-02-27 LAB — LIPID PANEL W/REFLEX DIRECT LDL
Cholesterol: 188 mg/dL (ref <200)
HDL: 42 mg/dL — ABNORMAL LOW (ref >=50)
LDL Cholesterol (Calc): 126 mg/dL (calc) — ABNORMAL HIGH
Non-HDL Cholesterol (Calc): 146 mg/dL (calc) — ABNORMAL HIGH (ref <130)
Total CHOL/HDL Ratio: 4.5 (calc) (ref <5.0)
Triglycerides: 101 mg/dL (ref <150)

## 2022-03-01 DIAGNOSIS — D2239 Melanocytic nevi of other parts of face: Secondary | ICD-10-CM | POA: Diagnosis not present

## 2022-03-01 DIAGNOSIS — D225 Melanocytic nevi of trunk: Secondary | ICD-10-CM | POA: Diagnosis not present

## 2022-03-01 DIAGNOSIS — L578 Other skin changes due to chronic exposure to nonionizing radiation: Secondary | ICD-10-CM | POA: Diagnosis not present

## 2022-03-01 DIAGNOSIS — F4320 Adjustment disorder, unspecified: Secondary | ICD-10-CM | POA: Diagnosis not present

## 2022-03-01 NOTE — Progress Notes (Addendum)
Keiri,   Kidney, liver, glucose looks great.  Total cholesterol did improve from 6 years ago.  HDL better.  TG normal.  LDL under 130 which is to goal for current risk factors.  Great job!

## 2022-03-15 DIAGNOSIS — F4320 Adjustment disorder, unspecified: Secondary | ICD-10-CM | POA: Diagnosis not present

## 2022-03-22 DIAGNOSIS — N941 Unspecified dyspareunia: Secondary | ICD-10-CM | POA: Diagnosis not present

## 2022-05-11 NOTE — Progress Notes (Unsigned)
   Acute Office Visit  Subjective:     Patient ID: Cynthia Christensen, female    DOB: 1985-10-14, 37 y.o.   MRN: [id number]  No chief complaint on file.   HPI Patient is in today for ***  ROS      Objective:    There were no vitals taken for this visit. {Vitals History (Optional):23777}  Physical Exam  No results found for any visits on 05/12/22.      Assessment & Plan:   Problem List Items Addressed This Visit   None   No orders of the defined types were placed in this encounter.   No follow-ups on file.  Charlton Amor, DO

## 2022-05-12 ENCOUNTER — Encounter: Payer: Self-pay | Admitting: Family Medicine

## 2022-05-12 ENCOUNTER — Ambulatory Visit (INDEPENDENT_AMBULATORY_CARE_PROVIDER_SITE_OTHER): Payer: BC Managed Care – PPO | Admitting: Family Medicine

## 2022-05-12 VITALS — BP 115/78 | HR 60 | Temp 97.7°F | Ht 61.0 in | Wt 180.0 lb

## 2022-05-12 DIAGNOSIS — J0100 Acute maxillary sinusitis, unspecified: Secondary | ICD-10-CM | POA: Diagnosis not present

## 2022-05-12 DIAGNOSIS — F411 Generalized anxiety disorder: Secondary | ICD-10-CM

## 2022-05-12 DIAGNOSIS — D509 Iron deficiency anemia, unspecified: Secondary | ICD-10-CM

## 2022-05-12 DIAGNOSIS — J0101 Acute recurrent maxillary sinusitis: Secondary | ICD-10-CM | POA: Insufficient documentation

## 2022-05-12 MED ORDER — ESCITALOPRAM OXALATE 5 MG PO TABS: 5.0000 mg | ORAL_TABLET | Freq: Every day | ORAL | 3 refills | Status: DC

## 2022-05-12 MED ORDER — AMOXICILLIN-POT CLAVULANATE 875-125 MG PO TABS: 1.0000 | ORAL_TABLET | Freq: Two times a day (BID) | ORAL | 0 refills | Status: AC

## 2022-05-12 NOTE — Assessment & Plan Note (Addendum)
-   pt says she has a hx of anxiety and depression that were present post partum. She feels like she is getting back to this state. She is already in therapy  - have started pt on lexapro  for anxiety (she requested lowest dose possible to start) we had a long discussion about anxiety and she is feeling very overwhelmed with her loss of job and her husband's loss of job

## 2022-05-12 NOTE — Assessment & Plan Note (Signed)
-   pt has hx of notes increased fatigue. Will go ahead and order iron panel - unable to tolerate iron supplements due to severe constipation

## 2022-05-12 NOTE — Assessment & Plan Note (Signed)
-   symptoms have been present for over two weeks associated with sinus pain and congestion. Abx is warranted at this time based on symptoms and duration of symptoms

## 2022-05-13 LAB — IRON,TIBC AND FERRITIN PANEL
%SAT: 23 % (calc) (ref 16–45)
Ferritin: 29 ng/mL (ref 16–154)
Iron: 68 ug/dL (ref 40–190)
TIBC: 302 mcg/dL (calc) (ref 250–450)

## 2022-05-13 LAB — CBC
HCT: 37 % (ref 35.0–45.0)
Hemoglobin: 12.7 g/dL (ref 11.7–15.5)
MCH: 31.2 pg (ref 27.0–33.0)
MCHC: 34.3 g/dL (ref 32.0–36.0)
MCV: 90.9 fL (ref 80.0–100.0)
MPV: 9.8 fL (ref 7.5–12.5)
Platelets: 243 10*3/uL (ref 140–400)
RBC: 4.07 10*6/uL (ref 3.80–5.10)
RDW: 12.2 % (ref 11.0–15.0)
WBC: 5.2 10*3/uL (ref 3.8–10.8)

## 2022-05-14 ENCOUNTER — Ambulatory Visit: Payer: BC Managed Care – PPO | Admitting: Physician Assistant

## 2022-05-17 DIAGNOSIS — N941 Unspecified dyspareunia: Secondary | ICD-10-CM | POA: Diagnosis not present

## 2022-05-24 ENCOUNTER — Encounter: Payer: Self-pay | Admitting: Family Medicine

## 2022-06-25 ENCOUNTER — Ambulatory Visit (INDEPENDENT_AMBULATORY_CARE_PROVIDER_SITE_OTHER): Payer: Managed Care, Other (non HMO) | Admitting: Family Medicine

## 2022-06-25 VITALS — BP 110/84 | HR 85 | Ht 61.0 in | Wt 183.0 lb

## 2022-06-25 DIAGNOSIS — J0101 Acute recurrent maxillary sinusitis: Secondary | ICD-10-CM

## 2022-06-25 DIAGNOSIS — F411 Generalized anxiety disorder: Secondary | ICD-10-CM

## 2022-06-25 MED ORDER — DOXYCYCLINE HYCLATE 100 MG PO TABS: 100.0000 mg | ORAL_TABLET | Freq: Two times a day (BID) | ORAL | 0 refills | Status: AC

## 2022-06-25 MED ORDER — ESCITALOPRAM OXALATE 5 MG PO TABS: 5.0000 mg | ORAL_TABLET | Freq: Every day | ORAL | 2 refills | Status: DC

## 2022-06-25 MED ORDER — FLUTICASONE PROPIONATE 50 MCG/ACT NA SUSP: 2.0000 | Freq: Every day | NASAL | 6 refills | Status: AC

## 2022-06-25 NOTE — Progress Notes (Signed)
Established patient visit   Patient: Cynthia Christensen   DOB: 1985-04-27   37 y.o. Female  MRN: [id number] Visit Date: 06/25/2022  Today's healthcare provider: Charlton Amor, DO   Chief Complaint  Patient presents with   Follow-up    SUBJECTIVE    Chief Complaint  Patient presents with   Follow-up   HPI  Pt presents for follow up on sinusitis. She was given doxycycline and cough syrup. Says the doxycycline helped but when she stopped it she still feels sinus pain and pressure. Prior to doxycycline she was on Augmentin.   GAD Lexapro 5mg  is working well for her. Needs refills.   Review of Systems  Constitutional:  Negative for activity change, fatigue and fever.  Respiratory:  Negative for cough and shortness of breath.   Cardiovascular:  Negative for chest pain.  Gastrointestinal:  Negative for abdominal pain.  Genitourinary:  Negative for difficulty urinating.       Current Meds  Medication Sig   albuterol (PROVENTIL HFA;VENTOLIN HFA) 108 (90 Base) MCG/ACT inhaler Inhale 2 puffs into the lungs every 6 (six) hours as needed for wheezing or shortness of breath.   doxycycline (VIBRA-TABS) 100 MG tablet Take 1 tablet (100 mg total) by mouth 2 (two) times daily for 7 days.   fluticasone (FLONASE) 50 MCG/ACT nasal spray Place 2 sprays into both nostrils daily.   hydrocortisone cream 1 % Apply 1 Application topically 2 (two) times daily.   MAGNESIUM PO Take by mouth daily.   Multiple Vitamin (MULTIVITAMIN) capsule Take 1 capsule by mouth daily.   VITAMIN D PO Take 800 mg by mouth daily.   [DISCONTINUED] escitalopram (LEXAPRO) 5 MG tablet Take 1 tablet (5 mg total) by mouth daily.    OBJECTIVE    BP 110/84   Pulse 85   Ht 5\' 1"  (1.549 m)   Wt 183 lb (83 kg)   SpO2 99%   BMI 34.58 kg/m   Physical Exam Vitals and nursing note reviewed.  Constitutional:      General: She is not in acute distress.    Appearance: Normal appearance.  HENT:     Head: Normocephalic and  atraumatic.     Right Ear: External ear normal.     Left Ear: External ear normal.     Nose: Nose normal.  Eyes:     Conjunctiva/sclera: Conjunctivae normal.  Cardiovascular:     Rate and Rhythm: Normal rate and regular rhythm.  Pulmonary:     Effort: Pulmonary effort is normal.     Breath sounds: Normal breath sounds.  Neurological:     General: No focal deficit present.     Mental Status: She is alert and oriented to person, place, and time.  Psychiatric:        Mood and Affect: Mood normal.        Behavior: Behavior normal.        Thought Content: Thought content normal.        Judgment: Judgment normal.        ASSESSMENT & PLAN    Problem List Items Addressed This Visit       Respiratory   Acute recurrent maxillary sinusitis - Primary    - Will go ahead and do another round of doxycycline  - have sent referral to ent for recurrent sinusitis - ears have fluid on exam, have gone ahead and prescribed flonase. She is also taking zyrtec daily and I encouraged her to continue this  Relevant Medications   doxycycline (VIBRA-TABS) 100 MG tablet   fluticasone (FLONASE) 50 MCG/ACT nasal spray   Other Relevant Orders   Ambulatory referral to ENT     Other   GAD (generalized anxiety disorder)    Lexapro 5mg  doing well for her. Says she has noticed less irritability and is more tolerant of things at home. Will refill She is moving in September to [place] area due to husband's job and knows she will be under a lot of stress. We will keep her on 5mg  and discussed ability to increase to 10mg  but she wants to keep the 5mg  dose for now - denies suicidal or homicidal ideation      Relevant Medications   escitalopram (LEXAPRO) 5 MG tablet    Return in about 3 months (around 09/25/2022).      Meds ordered this encounter  Medications   doxycycline (VIBRA-TABS) 100 MG tablet    Sig: Take 1 tablet (100 mg total) by mouth 2 (two) times daily for 7 days.    Dispense:  14  tablet    Refill:  0   fluticasone (FLONASE) 50 MCG/ACT nasal spray    Sig: Place 2 sprays into both nostrils daily.    Dispense:  16 g    Refill:  6   escitalopram (LEXAPRO) 5 MG tablet    Sig: Take 1 tablet (5 mg total) by mouth daily.    Dispense:  90 tablet    Refill:  2    Orders Placed This Encounter  Procedures   Ambulatory referral to ENT    Referral Priority:   Routine    Referral Type:   Consultation    Referral Reason:   Specialty Services Required    Requested Specialty:   Otolaryngology    Number of Visits Requested:   1     Charlton Amor, DO  [facility] at [facility] [phone] (phone) [phone] (fax)  [facility]

## 2022-06-25 NOTE — Assessment & Plan Note (Signed)
-   Will go ahead and do another round of doxycycline  - have sent referral to ent for recurrent sinusitis - ears have fluid on exam, have gone ahead and prescribed flonase. She is also taking zyrtec daily and I encouraged her to continue this

## 2022-06-25 NOTE — Assessment & Plan Note (Signed)
Lexapro 5mg  doing well for her. Says she has noticed less irritability and is more tolerant of things at home. Will refill She is moving in September to [place] area due to husband's job and knows she will be under a lot of stress. We will keep her on 5mg  and discussed ability to increase to 10mg  but she wants to keep the 5mg  dose for now - denies suicidal or homicidal ideation

## 2022-07-20 ENCOUNTER — Encounter: Payer: Self-pay | Admitting: Family Medicine

## 2022-07-20 ENCOUNTER — Other Ambulatory Visit: Payer: Self-pay | Admitting: Family Medicine

## 2022-07-20 MED ORDER — ESCITALOPRAM OXALATE 10 MG PO TABS: 10.0000 mg | ORAL_TABLET | Freq: Every day | ORAL | 1 refills | Status: DC

## 2022-09-29 ENCOUNTER — Encounter: Payer: Self-pay | Admitting: Family Medicine

## 2022-10-04 ENCOUNTER — Other Ambulatory Visit: Payer: Self-pay

## 2022-10-04 MED ORDER — ESCITALOPRAM OXALATE 10 MG PO TABS: 10.0000 mg | ORAL_TABLET | Freq: Every day | ORAL | 0 refills | Status: DC

## 2022-12-23 ENCOUNTER — Other Ambulatory Visit: Payer: Self-pay | Admitting: Family Medicine
